# Patient Record
Sex: Female | Born: 1945
Health system: Southern US, Community
[De-identification: ages and names within clinical notes are randomized; demographics above are authoritative.]

## PROBLEM LIST (undated history)

## (undated) DIAGNOSIS — S42209A Unspecified fracture of upper end of unspecified humerus, initial encounter for closed fracture: Secondary | ICD-10-CM

## (undated) DIAGNOSIS — D649 Anemia, unspecified: Secondary | ICD-10-CM

## (undated) DIAGNOSIS — T402X5A Adverse effect of other opioids, initial encounter: Secondary | ICD-10-CM

## (undated) DIAGNOSIS — M419 Scoliosis, unspecified: Secondary | ICD-10-CM

## (undated) DIAGNOSIS — K5903 Drug induced constipation: Secondary | ICD-10-CM

## (undated) DIAGNOSIS — M1990 Unspecified osteoarthritis, unspecified site: Secondary | ICD-10-CM

## (undated) DIAGNOSIS — E039 Hypothyroidism, unspecified: Secondary | ICD-10-CM

## (undated) HISTORY — DX: Scoliosis, unspecified: M41.9

## (undated) HISTORY — PX: OTHER SURGICAL HISTORY: SHX169

## (undated) HISTORY — DX: Hypothyroidism, unspecified: E03.9

## (undated) HISTORY — PX: BREAST SURGERY: SHX581

---

## 1998-08-07 ENCOUNTER — Other Ambulatory Visit: Admission: RE | Admit: 1998-08-07 | Discharge: 1998-08-07 | Payer: Self-pay | Admitting: Obstetrics and Gynecology

## 1999-09-04 ENCOUNTER — Encounter: Admission: RE | Admit: 1999-09-04 | Discharge: 1999-09-04 | Payer: Self-pay | Admitting: Family Medicine

## 1999-09-04 ENCOUNTER — Encounter: Payer: Self-pay | Admitting: Family Medicine

## 2000-08-17 ENCOUNTER — Encounter: Payer: Self-pay | Admitting: Family Medicine

## 2000-08-17 ENCOUNTER — Encounter: Admission: RE | Admit: 2000-08-17 | Discharge: 2000-08-17 | Payer: Self-pay | Admitting: Family Medicine

## 2001-01-02 ENCOUNTER — Other Ambulatory Visit: Admission: RE | Admit: 2001-01-02 | Discharge: 2001-01-02 | Payer: Self-pay | Admitting: Obstetrics and Gynecology

## 2003-02-04 ENCOUNTER — Other Ambulatory Visit: Admission: RE | Admit: 2003-02-04 | Discharge: 2003-02-04 | Payer: Self-pay | Admitting: Obstetrics and Gynecology

## 2010-09-25 ENCOUNTER — Encounter: Payer: Self-pay | Admitting: Family Medicine

## 2010-09-25 ENCOUNTER — Ambulatory Visit (INDEPENDENT_AMBULATORY_CARE_PROVIDER_SITE_OTHER): Payer: BC Managed Care – PPO | Admitting: Family Medicine

## 2010-09-25 ENCOUNTER — Ambulatory Visit (HOSPITAL_BASED_OUTPATIENT_CLINIC_OR_DEPARTMENT_OTHER)
Admission: RE | Admit: 2010-09-25 | Discharge: 2010-09-25 | Disposition: A | Discharge status: Home / Self Care | Payer: BC Managed Care – PPO | Source: Ambulatory Visit | Attending: Family Medicine | Admitting: Family Medicine

## 2010-09-25 VITALS — BP 168/84 | HR 80 | Temp 98.5°F | Ht 60.0 in | Wt 103.8 lb

## 2010-09-25 DIAGNOSIS — M2140 Flat foot [pes planus] (acquired), unspecified foot: Secondary | ICD-10-CM

## 2010-09-25 DIAGNOSIS — S8990XA Unspecified injury of unspecified lower leg, initial encounter: Secondary | ICD-10-CM

## 2010-09-25 DIAGNOSIS — M25579 Pain in unspecified ankle and joints of unspecified foot: Secondary | ICD-10-CM | POA: Insufficient documentation

## 2010-09-25 DIAGNOSIS — M79609 Pain in unspecified limb: Secondary | ICD-10-CM

## 2010-09-25 DIAGNOSIS — M7740 Metatarsalgia, unspecified foot: Secondary | ICD-10-CM

## 2010-09-25 DIAGNOSIS — W208XXA Other cause of strike by thrown, projected or falling object, initial encounter: Secondary | ICD-10-CM

## 2010-09-25 DIAGNOSIS — M79671 Pain in right foot: Secondary | ICD-10-CM

## 2010-09-25 DIAGNOSIS — M19079 Primary osteoarthritis, unspecified ankle and foot: Secondary | ICD-10-CM | POA: Insufficient documentation

## 2010-09-25 DIAGNOSIS — M7750 Other enthesopathy of unspecified foot: Secondary | ICD-10-CM

## 2010-09-25 DIAGNOSIS — S99929A Unspecified injury of unspecified foot, initial encounter: Secondary | ICD-10-CM

## 2010-09-25 MED ORDER — DICLOFENAC SODIUM 75 MG PO TBEC: 75.0000 mg | DELAYED_RELEASE_TABLET | Freq: Two times a day (BID) | ORAL | Status: AC

## 2010-09-25 NOTE — Patient Instructions (Addendum)
Your x-rays on one view show a probable nondisplaced fracture of your 2nd metatarsal. This heals really well in 4-6 weeks though nutrient foramina also look like this (where blood vessels go into the bone). Wear comfortable shoes with your orthotics.  These are in excellent shape. Ice the area 15 minutes at a time 3-4 times a day. Voltaren twice a day with food for pain then as needed. Postop shoe or boot is a consideration if limping (I do not think you need either of these). The metatarsal pads should be behind the bones of your forefoot (should feel like a lump in your arch). Ok to exercise as long as not limping and pain is less than a 3 on a scale of 1-10. Follow up with me in 4 weeks for a recheck on how you're doing - you can cancel if you're completely better.

## 2010-09-26 ENCOUNTER — Encounter: Payer: Self-pay | Admitting: Family Medicine

## 2010-09-26 DIAGNOSIS — M79671 Pain in right foot: Secondary | ICD-10-CM | POA: Insufficient documentation

## 2010-09-26 DIAGNOSIS — M7740 Metatarsalgia, unspecified foot: Secondary | ICD-10-CM | POA: Insufficient documentation

## 2010-09-26 DIAGNOSIS — M2140 Flat foot [pes planus] (acquired), unspecified foot: Secondary | ICD-10-CM | POA: Insufficient documentation

## 2010-09-26 NOTE — Assessment & Plan Note (Signed)
shown how to use metatarsal pads

## 2010-09-26 NOTE — Assessment & Plan Note (Signed)
continue use of orthotics.

## 2010-09-26 NOTE — Assessment & Plan Note (Signed)
x-rays on one view show possible 2nd MT fracture vs nutrient foramen. She is tender at this area so may be a true fracture though fractures at this location typically heal very well at 4-6 weeks (good blood flow, excellent stability from surrounding structures).  She is walking without a limp and with minimal pain on regular activities.  We discussed postop shoe and cam walker but as she has only mild pain, feel she can do well with a supportive shoe and her orthotics.  Icing, continue voltaren, elevation.  I showed her how to use metatarsal pads as she does have transverse arch collapse and reported sometimes she'll get pain in forefoot (prior to injury) especially if she walks barefoot.

## 2010-09-26 NOTE — Progress Notes (Signed)
Subjective:    Patient ID: [name] Ruiz, female    DOB: 1945-04-30, 65 y.o.   MRN: [id number]  HPI PCP: Dr. Mila Palmer  65 yo F here with right foot injury.  Patient reports approximately 1 week ago she dropped a bottle of Windex on left foot then again the next day she dropped a shampoo bottle on right forefoot. Felt ok after first injury but after second, developed swelling but no bruising, pain at area dropped bottle. Pain has persisted and is now a 5/10 but not limping. Has been unable to walk as much as she would like for exercise - only able to walk for 20 minutes and had to stop due to pain. Since Monday has taken voltaren which has helped A masseuse also helped massage the area which helped.  Iced last night, tried epsom salt. 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 She has seen a podiatrist in the past, treated with orthotics that are very comfortable.  Past Medical History  Diagnosis Date  . Hypothyroidism   . Scoliosis     No current outpatient prescriptions on file prior to visit.    No past surgical history on file.  Allergies  Allergen Reactions  . Cortisone   . Seldane (Terfenadine)     History   Social History  . Marital Status: Married    Spouse Name: N/A    Number of Children: N/A  . Years of Education: N/A   Occupational History  . Not on file.   Social History Main Topics  . Smoking status: Never Smoker   . Smokeless tobacco: Not on file  . Alcohol Use: Not on file  . Drug Use: Not on file  . Sexually Active: Not on file   Other Topics Concern  . Not on file   Social History Narrative  . No narrative on file    Family History  Problem Relation Age of Onset  . Hypertension Mother   . Heart attack Mother   . Diabetes Neg Hx     BP 168/84  Pulse 80  Temp(Src) 98.5 F (36.9 C) (Oral)  Ht 5' (1.524 m)  Wt 103 lb 12.8 oz (47.083 kg)  BMI 20.27 kg/m2  Review of  Systems See HPI above.    Objective:   Physical Exam Gen: NAD R foot: Minimal swelling forefoot but no bruising, gross deformity. Mod overpronation/pes planus.  Collapse of 2nd-4th MT heads but no callus formation. TTP 2nd and 3rd MTs distally.  No other TTP about foot or ankle. FROM ankle, pain on dorsiflexion of toes mostly at 2nd/3rd MT area. Sensation intact to light touch, < 2 sec cap refill.     Assessment & Plan:  1. Right foot pain - x-rays on one view show possible 2nd MT fracture vs nutrient foramen. She is tender at this area so may be a true fracture though fractures at this location typically heal very well at 4-6 weeks (good blood flow, excellent stability from surrounding structures).  She is walking without a limp and with minimal pain on regular activities.  We discussed postop shoe and cam walker but as she has only mild pain, feel she can do well with a supportive shoe and her orthotics.  Icing, continue voltaren, elevation.  I showed her how to use metatarsal pads as she does have transverse arch collapse and reported sometimes she'll get pain in forefoot (prior to injury) especially if she  walks barefoot.  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  Do not think surgical intervention warranted unless clinically develops worsening pain at this area.  2. Metatarsalgia - shown how to use metatarsal pads  3. Pes planus - continue use of orthotics.

## 2011-06-23 DIAGNOSIS — M545 Low back pain: Secondary | ICD-10-CM | POA: Diagnosis not present

## 2011-06-23 DIAGNOSIS — M999 Biomechanical lesion, unspecified: Secondary | ICD-10-CM | POA: Diagnosis not present

## 2011-07-07 DIAGNOSIS — M545 Low back pain: Secondary | ICD-10-CM | POA: Diagnosis not present

## 2011-07-07 DIAGNOSIS — M999 Biomechanical lesion, unspecified: Secondary | ICD-10-CM | POA: Diagnosis not present

## 2011-07-13 DIAGNOSIS — E039 Hypothyroidism, unspecified: Secondary | ICD-10-CM | POA: Diagnosis not present

## 2011-07-21 DIAGNOSIS — M999 Biomechanical lesion, unspecified: Secondary | ICD-10-CM | POA: Diagnosis not present

## 2011-07-21 DIAGNOSIS — M545 Low back pain: Secondary | ICD-10-CM | POA: Diagnosis not present

## 2011-07-30 DIAGNOSIS — M25579 Pain in unspecified ankle and joints of unspecified foot: Secondary | ICD-10-CM | POA: Diagnosis not present

## 2011-07-30 DIAGNOSIS — M359 Systemic involvement of connective tissue, unspecified: Secondary | ICD-10-CM | POA: Diagnosis not present

## 2011-08-04 DIAGNOSIS — M5137 Other intervertebral disc degeneration, lumbosacral region: Secondary | ICD-10-CM | POA: Diagnosis not present

## 2011-08-04 DIAGNOSIS — M5430 Sciatica, unspecified side: Secondary | ICD-10-CM | POA: Diagnosis not present

## 2011-08-04 DIAGNOSIS — M999 Biomechanical lesion, unspecified: Secondary | ICD-10-CM | POA: Diagnosis not present

## 2011-08-04 DIAGNOSIS — M5106 Intervertebral disc disorders with myelopathy, lumbar region: Secondary | ICD-10-CM | POA: Diagnosis not present

## 2011-08-10 DIAGNOSIS — M999 Biomechanical lesion, unspecified: Secondary | ICD-10-CM | POA: Diagnosis not present

## 2011-08-10 DIAGNOSIS — M5137 Other intervertebral disc degeneration, lumbosacral region: Secondary | ICD-10-CM | POA: Diagnosis not present

## 2011-08-10 DIAGNOSIS — M5106 Intervertebral disc disorders with myelopathy, lumbar region: Secondary | ICD-10-CM | POA: Diagnosis not present

## 2011-08-10 DIAGNOSIS — M5430 Sciatica, unspecified side: Secondary | ICD-10-CM | POA: Diagnosis not present

## 2011-09-08 DIAGNOSIS — M5106 Intervertebral disc disorders with myelopathy, lumbar region: Secondary | ICD-10-CM | POA: Diagnosis not present

## 2011-09-08 DIAGNOSIS — M5430 Sciatica, unspecified side: Secondary | ICD-10-CM | POA: Diagnosis not present

## 2011-09-08 DIAGNOSIS — M999 Biomechanical lesion, unspecified: Secondary | ICD-10-CM | POA: Diagnosis not present

## 2011-09-08 DIAGNOSIS — M5137 Other intervertebral disc degeneration, lumbosacral region: Secondary | ICD-10-CM | POA: Diagnosis not present

## 2011-09-09 DIAGNOSIS — R262 Difficulty in walking, not elsewhere classified: Secondary | ICD-10-CM | POA: Diagnosis not present

## 2011-09-09 DIAGNOSIS — L988 Other specified disorders of the skin and subcutaneous tissue: Secondary | ICD-10-CM | POA: Diagnosis not present

## 2011-09-22 DIAGNOSIS — M999 Biomechanical lesion, unspecified: Secondary | ICD-10-CM | POA: Diagnosis not present

## 2011-09-22 DIAGNOSIS — M5106 Intervertebral disc disorders with myelopathy, lumbar region: Secondary | ICD-10-CM | POA: Diagnosis not present

## 2011-09-22 DIAGNOSIS — M5430 Sciatica, unspecified side: Secondary | ICD-10-CM | POA: Diagnosis not present

## 2011-09-22 DIAGNOSIS — M5137 Other intervertebral disc degeneration, lumbosacral region: Secondary | ICD-10-CM | POA: Diagnosis not present

## 2011-09-23 DIAGNOSIS — M359 Systemic involvement of connective tissue, unspecified: Secondary | ICD-10-CM | POA: Diagnosis not present

## 2011-09-23 DIAGNOSIS — Z0000 Encounter for general adult medical examination without abnormal findings: Secondary | ICD-10-CM | POA: Diagnosis not present

## 2011-09-23 DIAGNOSIS — R894 Abnormal immunological findings in specimens from other organs, systems and tissues: Secondary | ICD-10-CM | POA: Diagnosis not present

## 2011-09-23 DIAGNOSIS — M949 Disorder of cartilage, unspecified: Secondary | ICD-10-CM | POA: Diagnosis not present

## 2011-09-23 DIAGNOSIS — I10 Essential (primary) hypertension: Secondary | ICD-10-CM | POA: Diagnosis not present

## 2011-09-23 DIAGNOSIS — E785 Hyperlipidemia, unspecified: Secondary | ICD-10-CM | POA: Diagnosis not present

## 2011-09-23 DIAGNOSIS — E069 Thyroiditis, unspecified: Secondary | ICD-10-CM | POA: Diagnosis not present

## 2011-09-23 DIAGNOSIS — Z23 Encounter for immunization: Secondary | ICD-10-CM | POA: Diagnosis not present

## 2012-02-02 DIAGNOSIS — M5137 Other intervertebral disc degeneration, lumbosacral region: Secondary | ICD-10-CM | POA: Diagnosis not present

## 2012-02-02 DIAGNOSIS — M999 Biomechanical lesion, unspecified: Secondary | ICD-10-CM | POA: Diagnosis not present

## 2012-02-02 DIAGNOSIS — M5106 Intervertebral disc disorders with myelopathy, lumbar region: Secondary | ICD-10-CM | POA: Diagnosis not present

## 2012-02-02 DIAGNOSIS — M5430 Sciatica, unspecified side: Secondary | ICD-10-CM | POA: Diagnosis not present

## 2012-02-10 DIAGNOSIS — E069 Thyroiditis, unspecified: Secondary | ICD-10-CM | POA: Diagnosis not present

## 2012-02-16 DIAGNOSIS — M5106 Intervertebral disc disorders with myelopathy, lumbar region: Secondary | ICD-10-CM | POA: Diagnosis not present

## 2012-02-16 DIAGNOSIS — M5430 Sciatica, unspecified side: Secondary | ICD-10-CM | POA: Diagnosis not present

## 2012-02-16 DIAGNOSIS — M5137 Other intervertebral disc degeneration, lumbosacral region: Secondary | ICD-10-CM | POA: Diagnosis not present

## 2012-02-16 DIAGNOSIS — M999 Biomechanical lesion, unspecified: Secondary | ICD-10-CM | POA: Diagnosis not present

## 2012-03-13 DIAGNOSIS — M5106 Intervertebral disc disorders with myelopathy, lumbar region: Secondary | ICD-10-CM | POA: Diagnosis not present

## 2012-03-13 DIAGNOSIS — M999 Biomechanical lesion, unspecified: Secondary | ICD-10-CM | POA: Diagnosis not present

## 2012-03-13 DIAGNOSIS — M5430 Sciatica, unspecified side: Secondary | ICD-10-CM | POA: Diagnosis not present

## 2012-03-13 DIAGNOSIS — M5137 Other intervertebral disc degeneration, lumbosacral region: Secondary | ICD-10-CM | POA: Diagnosis not present

## 2012-05-09 DIAGNOSIS — M999 Biomechanical lesion, unspecified: Secondary | ICD-10-CM | POA: Diagnosis not present

## 2012-05-09 DIAGNOSIS — M5430 Sciatica, unspecified side: Secondary | ICD-10-CM | POA: Diagnosis not present

## 2012-05-09 DIAGNOSIS — M5137 Other intervertebral disc degeneration, lumbosacral region: Secondary | ICD-10-CM | POA: Diagnosis not present

## 2012-05-09 DIAGNOSIS — M5106 Intervertebral disc disorders with myelopathy, lumbar region: Secondary | ICD-10-CM | POA: Diagnosis not present

## 2012-06-22 DIAGNOSIS — M359 Systemic involvement of connective tissue, unspecified: Secondary | ICD-10-CM | POA: Diagnosis not present

## 2012-06-22 DIAGNOSIS — R894 Abnormal immunological findings in specimens from other organs, systems and tissues: Secondary | ICD-10-CM | POA: Diagnosis not present

## 2012-06-22 DIAGNOSIS — M25579 Pain in unspecified ankle and joints of unspecified foot: Secondary | ICD-10-CM | POA: Diagnosis not present

## 2012-07-03 DIAGNOSIS — M5106 Intervertebral disc disorders with myelopathy, lumbar region: Secondary | ICD-10-CM | POA: Diagnosis not present

## 2012-07-03 DIAGNOSIS — M5137 Other intervertebral disc degeneration, lumbosacral region: Secondary | ICD-10-CM | POA: Diagnosis not present

## 2012-07-03 DIAGNOSIS — M999 Biomechanical lesion, unspecified: Secondary | ICD-10-CM | POA: Diagnosis not present

## 2012-07-03 DIAGNOSIS — M5430 Sciatica, unspecified side: Secondary | ICD-10-CM | POA: Diagnosis not present

## 2012-07-18 DIAGNOSIS — M5137 Other intervertebral disc degeneration, lumbosacral region: Secondary | ICD-10-CM | POA: Diagnosis not present

## 2012-07-18 DIAGNOSIS — M5106 Intervertebral disc disorders with myelopathy, lumbar region: Secondary | ICD-10-CM | POA: Diagnosis not present

## 2012-07-18 DIAGNOSIS — M5430 Sciatica, unspecified side: Secondary | ICD-10-CM | POA: Diagnosis not present

## 2012-07-18 DIAGNOSIS — M999 Biomechanical lesion, unspecified: Secondary | ICD-10-CM | POA: Diagnosis not present

## 2012-07-31 DIAGNOSIS — M999 Biomechanical lesion, unspecified: Secondary | ICD-10-CM | POA: Diagnosis not present

## 2012-07-31 DIAGNOSIS — M5137 Other intervertebral disc degeneration, lumbosacral region: Secondary | ICD-10-CM | POA: Diagnosis not present

## 2012-07-31 DIAGNOSIS — M5106 Intervertebral disc disorders with myelopathy, lumbar region: Secondary | ICD-10-CM | POA: Diagnosis not present

## 2012-07-31 DIAGNOSIS — M5430 Sciatica, unspecified side: Secondary | ICD-10-CM | POA: Diagnosis not present

## 2012-08-15 DIAGNOSIS — M5137 Other intervertebral disc degeneration, lumbosacral region: Secondary | ICD-10-CM | POA: Diagnosis not present

## 2012-08-15 DIAGNOSIS — M999 Biomechanical lesion, unspecified: Secondary | ICD-10-CM | POA: Diagnosis not present

## 2012-08-15 DIAGNOSIS — M5430 Sciatica, unspecified side: Secondary | ICD-10-CM | POA: Diagnosis not present

## 2012-08-15 DIAGNOSIS — M5106 Intervertebral disc disorders with myelopathy, lumbar region: Secondary | ICD-10-CM | POA: Diagnosis not present

## 2012-09-01 DIAGNOSIS — M5430 Sciatica, unspecified side: Secondary | ICD-10-CM | POA: Diagnosis not present

## 2012-09-01 DIAGNOSIS — M999 Biomechanical lesion, unspecified: Secondary | ICD-10-CM | POA: Diagnosis not present

## 2012-09-01 DIAGNOSIS — M5106 Intervertebral disc disorders with myelopathy, lumbar region: Secondary | ICD-10-CM | POA: Diagnosis not present

## 2012-09-01 DIAGNOSIS — M5137 Other intervertebral disc degeneration, lumbosacral region: Secondary | ICD-10-CM | POA: Diagnosis not present

## 2012-09-19 DIAGNOSIS — M5430 Sciatica, unspecified side: Secondary | ICD-10-CM | POA: Diagnosis not present

## 2012-09-19 DIAGNOSIS — M999 Biomechanical lesion, unspecified: Secondary | ICD-10-CM | POA: Diagnosis not present

## 2012-09-19 DIAGNOSIS — M5106 Intervertebral disc disorders with myelopathy, lumbar region: Secondary | ICD-10-CM | POA: Diagnosis not present

## 2012-09-19 DIAGNOSIS — M5137 Other intervertebral disc degeneration, lumbosacral region: Secondary | ICD-10-CM | POA: Diagnosis not present

## 2012-09-27 DIAGNOSIS — M5430 Sciatica, unspecified side: Secondary | ICD-10-CM | POA: Diagnosis not present

## 2012-09-27 DIAGNOSIS — M5106 Intervertebral disc disorders with myelopathy, lumbar region: Secondary | ICD-10-CM | POA: Diagnosis not present

## 2012-09-27 DIAGNOSIS — M999 Biomechanical lesion, unspecified: Secondary | ICD-10-CM | POA: Diagnosis not present

## 2012-09-27 DIAGNOSIS — M5137 Other intervertebral disc degeneration, lumbosacral region: Secondary | ICD-10-CM | POA: Diagnosis not present

## 2012-10-02 DIAGNOSIS — E039 Hypothyroidism, unspecified: Secondary | ICD-10-CM | POA: Diagnosis not present

## 2012-10-02 DIAGNOSIS — M899 Disorder of bone, unspecified: Secondary | ICD-10-CM | POA: Diagnosis not present

## 2012-10-02 DIAGNOSIS — Z23 Encounter for immunization: Secondary | ICD-10-CM | POA: Diagnosis not present

## 2012-10-02 DIAGNOSIS — E785 Hyperlipidemia, unspecified: Secondary | ICD-10-CM | POA: Diagnosis not present

## 2012-10-02 DIAGNOSIS — Z0000 Encounter for general adult medical examination without abnormal findings: Secondary | ICD-10-CM | POA: Diagnosis not present

## 2012-10-02 DIAGNOSIS — E871 Hypo-osmolality and hyponatremia: Secondary | ICD-10-CM | POA: Diagnosis not present

## 2012-10-02 DIAGNOSIS — M949 Disorder of cartilage, unspecified: Secondary | ICD-10-CM | POA: Diagnosis not present

## 2012-10-10 DIAGNOSIS — M999 Biomechanical lesion, unspecified: Secondary | ICD-10-CM | POA: Diagnosis not present

## 2012-10-10 DIAGNOSIS — M5106 Intervertebral disc disorders with myelopathy, lumbar region: Secondary | ICD-10-CM | POA: Diagnosis not present

## 2012-10-10 DIAGNOSIS — M5430 Sciatica, unspecified side: Secondary | ICD-10-CM | POA: Diagnosis not present

## 2012-10-10 DIAGNOSIS — M5137 Other intervertebral disc degeneration, lumbosacral region: Secondary | ICD-10-CM | POA: Diagnosis not present

## 2012-12-01 DIAGNOSIS — E039 Hypothyroidism, unspecified: Secondary | ICD-10-CM | POA: Diagnosis not present

## 2012-12-01 DIAGNOSIS — Z23 Encounter for immunization: Secondary | ICD-10-CM | POA: Diagnosis not present

## 2012-12-05 DIAGNOSIS — M999 Biomechanical lesion, unspecified: Secondary | ICD-10-CM | POA: Diagnosis not present

## 2012-12-05 DIAGNOSIS — M5430 Sciatica, unspecified side: Secondary | ICD-10-CM | POA: Diagnosis not present

## 2012-12-05 DIAGNOSIS — M5137 Other intervertebral disc degeneration, lumbosacral region: Secondary | ICD-10-CM | POA: Diagnosis not present

## 2012-12-05 DIAGNOSIS — M5106 Intervertebral disc disorders with myelopathy, lumbar region: Secondary | ICD-10-CM | POA: Diagnosis not present

## 2012-12-26 DIAGNOSIS — M5430 Sciatica, unspecified side: Secondary | ICD-10-CM | POA: Diagnosis not present

## 2012-12-26 DIAGNOSIS — M5137 Other intervertebral disc degeneration, lumbosacral region: Secondary | ICD-10-CM | POA: Diagnosis not present

## 2012-12-26 DIAGNOSIS — M5106 Intervertebral disc disorders with myelopathy, lumbar region: Secondary | ICD-10-CM | POA: Diagnosis not present

## 2012-12-26 DIAGNOSIS — M999 Biomechanical lesion, unspecified: Secondary | ICD-10-CM | POA: Diagnosis not present

## 2013-01-03 DIAGNOSIS — Z23 Encounter for immunization: Secondary | ICD-10-CM | POA: Diagnosis not present

## 2013-01-18 DIAGNOSIS — M5137 Other intervertebral disc degeneration, lumbosacral region: Secondary | ICD-10-CM | POA: Diagnosis not present

## 2013-01-18 DIAGNOSIS — M999 Biomechanical lesion, unspecified: Secondary | ICD-10-CM | POA: Diagnosis not present

## 2013-01-18 DIAGNOSIS — M5106 Intervertebral disc disorders with myelopathy, lumbar region: Secondary | ICD-10-CM | POA: Diagnosis not present

## 2013-01-18 DIAGNOSIS — M5430 Sciatica, unspecified side: Secondary | ICD-10-CM | POA: Diagnosis not present

## 2013-01-30 DIAGNOSIS — M5430 Sciatica, unspecified side: Secondary | ICD-10-CM | POA: Diagnosis not present

## 2013-01-30 DIAGNOSIS — M5137 Other intervertebral disc degeneration, lumbosacral region: Secondary | ICD-10-CM | POA: Diagnosis not present

## 2013-01-30 DIAGNOSIS — M999 Biomechanical lesion, unspecified: Secondary | ICD-10-CM | POA: Diagnosis not present

## 2013-01-30 DIAGNOSIS — M5106 Intervertebral disc disorders with myelopathy, lumbar region: Secondary | ICD-10-CM | POA: Diagnosis not present

## 2013-02-13 DIAGNOSIS — M999 Biomechanical lesion, unspecified: Secondary | ICD-10-CM | POA: Diagnosis not present

## 2013-02-13 DIAGNOSIS — M5137 Other intervertebral disc degeneration, lumbosacral region: Secondary | ICD-10-CM | POA: Diagnosis not present

## 2013-02-13 DIAGNOSIS — M5430 Sciatica, unspecified side: Secondary | ICD-10-CM | POA: Diagnosis not present

## 2013-02-13 DIAGNOSIS — M5106 Intervertebral disc disorders with myelopathy, lumbar region: Secondary | ICD-10-CM | POA: Diagnosis not present

## 2013-02-27 DIAGNOSIS — M999 Biomechanical lesion, unspecified: Secondary | ICD-10-CM | POA: Diagnosis not present

## 2013-02-27 DIAGNOSIS — M5106 Intervertebral disc disorders with myelopathy, lumbar region: Secondary | ICD-10-CM | POA: Diagnosis not present

## 2013-02-27 DIAGNOSIS — M5137 Other intervertebral disc degeneration, lumbosacral region: Secondary | ICD-10-CM | POA: Diagnosis not present

## 2013-02-27 DIAGNOSIS — M5430 Sciatica, unspecified side: Secondary | ICD-10-CM | POA: Diagnosis not present

## 2013-03-20 DIAGNOSIS — R197 Diarrhea, unspecified: Secondary | ICD-10-CM | POA: Diagnosis not present

## 2013-03-20 DIAGNOSIS — Z79899 Other long term (current) drug therapy: Secondary | ICD-10-CM | POA: Diagnosis not present

## 2013-03-20 DIAGNOSIS — M5137 Other intervertebral disc degeneration, lumbosacral region: Secondary | ICD-10-CM | POA: Diagnosis not present

## 2013-03-20 DIAGNOSIS — M999 Biomechanical lesion, unspecified: Secondary | ICD-10-CM | POA: Diagnosis not present

## 2013-03-20 DIAGNOSIS — E785 Hyperlipidemia, unspecified: Secondary | ICD-10-CM | POA: Diagnosis not present

## 2013-03-20 DIAGNOSIS — E039 Hypothyroidism, unspecified: Secondary | ICD-10-CM | POA: Diagnosis not present

## 2013-03-20 DIAGNOSIS — M5106 Intervertebral disc disorders with myelopathy, lumbar region: Secondary | ICD-10-CM | POA: Diagnosis not present

## 2013-03-20 DIAGNOSIS — M5430 Sciatica, unspecified side: Secondary | ICD-10-CM | POA: Diagnosis not present

## 2013-03-21 DIAGNOSIS — E039 Hypothyroidism, unspecified: Secondary | ICD-10-CM | POA: Diagnosis not present

## 2013-03-21 DIAGNOSIS — R197 Diarrhea, unspecified: Secondary | ICD-10-CM | POA: Diagnosis not present

## 2013-03-21 DIAGNOSIS — E785 Hyperlipidemia, unspecified: Secondary | ICD-10-CM | POA: Diagnosis not present

## 2013-04-10 DIAGNOSIS — M999 Biomechanical lesion, unspecified: Secondary | ICD-10-CM | POA: Diagnosis not present

## 2013-04-10 DIAGNOSIS — M5430 Sciatica, unspecified side: Secondary | ICD-10-CM | POA: Diagnosis not present

## 2013-04-10 DIAGNOSIS — M5106 Intervertebral disc disorders with myelopathy, lumbar region: Secondary | ICD-10-CM | POA: Diagnosis not present

## 2013-04-10 DIAGNOSIS — M5137 Other intervertebral disc degeneration, lumbosacral region: Secondary | ICD-10-CM | POA: Diagnosis not present

## 2013-04-18 DIAGNOSIS — M5137 Other intervertebral disc degeneration, lumbosacral region: Secondary | ICD-10-CM | POA: Diagnosis not present

## 2013-04-18 DIAGNOSIS — M5430 Sciatica, unspecified side: Secondary | ICD-10-CM | POA: Diagnosis not present

## 2013-04-18 DIAGNOSIS — M5106 Intervertebral disc disorders with myelopathy, lumbar region: Secondary | ICD-10-CM | POA: Diagnosis not present

## 2013-04-18 DIAGNOSIS — M999 Biomechanical lesion, unspecified: Secondary | ICD-10-CM | POA: Diagnosis not present

## 2013-05-01 DIAGNOSIS — M5137 Other intervertebral disc degeneration, lumbosacral region: Secondary | ICD-10-CM | POA: Diagnosis not present

## 2013-05-01 DIAGNOSIS — M999 Biomechanical lesion, unspecified: Secondary | ICD-10-CM | POA: Diagnosis not present

## 2013-05-01 DIAGNOSIS — M5106 Intervertebral disc disorders with myelopathy, lumbar region: Secondary | ICD-10-CM | POA: Diagnosis not present

## 2013-05-01 DIAGNOSIS — M5430 Sciatica, unspecified side: Secondary | ICD-10-CM | POA: Diagnosis not present

## 2013-05-04 DIAGNOSIS — Z23 Encounter for immunization: Secondary | ICD-10-CM | POA: Diagnosis not present

## 2013-06-19 DIAGNOSIS — E039 Hypothyroidism, unspecified: Secondary | ICD-10-CM | POA: Diagnosis not present

## 2013-06-19 DIAGNOSIS — E871 Hypo-osmolality and hyponatremia: Secondary | ICD-10-CM | POA: Diagnosis not present

## 2013-06-20 DIAGNOSIS — M999 Biomechanical lesion, unspecified: Secondary | ICD-10-CM | POA: Diagnosis not present

## 2013-06-20 DIAGNOSIS — M5106 Intervertebral disc disorders with myelopathy, lumbar region: Secondary | ICD-10-CM | POA: Diagnosis not present

## 2013-06-20 DIAGNOSIS — M5430 Sciatica, unspecified side: Secondary | ICD-10-CM | POA: Diagnosis not present

## 2013-06-20 DIAGNOSIS — M5137 Other intervertebral disc degeneration, lumbosacral region: Secondary | ICD-10-CM | POA: Diagnosis not present

## 2013-08-15 DIAGNOSIS — M5137 Other intervertebral disc degeneration, lumbosacral region: Secondary | ICD-10-CM | POA: Diagnosis not present

## 2013-08-15 DIAGNOSIS — M999 Biomechanical lesion, unspecified: Secondary | ICD-10-CM | POA: Diagnosis not present

## 2013-08-15 DIAGNOSIS — M5430 Sciatica, unspecified side: Secondary | ICD-10-CM | POA: Diagnosis not present

## 2013-08-15 DIAGNOSIS — M5106 Intervertebral disc disorders with myelopathy, lumbar region: Secondary | ICD-10-CM | POA: Diagnosis not present

## 2013-08-30 DIAGNOSIS — M5430 Sciatica, unspecified side: Secondary | ICD-10-CM | POA: Diagnosis not present

## 2013-08-30 DIAGNOSIS — M999 Biomechanical lesion, unspecified: Secondary | ICD-10-CM | POA: Diagnosis not present

## 2013-08-30 DIAGNOSIS — M5106 Intervertebral disc disorders with myelopathy, lumbar region: Secondary | ICD-10-CM | POA: Diagnosis not present

## 2013-08-30 DIAGNOSIS — M5137 Other intervertebral disc degeneration, lumbosacral region: Secondary | ICD-10-CM | POA: Diagnosis not present

## 2013-09-17 DIAGNOSIS — M5430 Sciatica, unspecified side: Secondary | ICD-10-CM | POA: Diagnosis not present

## 2013-09-17 DIAGNOSIS — M5137 Other intervertebral disc degeneration, lumbosacral region: Secondary | ICD-10-CM | POA: Diagnosis not present

## 2013-09-17 DIAGNOSIS — M999 Biomechanical lesion, unspecified: Secondary | ICD-10-CM | POA: Diagnosis not present

## 2013-09-17 DIAGNOSIS — M5106 Intervertebral disc disorders with myelopathy, lumbar region: Secondary | ICD-10-CM | POA: Diagnosis not present

## 2013-10-09 DIAGNOSIS — M5106 Intervertebral disc disorders with myelopathy, lumbar region: Secondary | ICD-10-CM | POA: Diagnosis not present

## 2013-10-09 DIAGNOSIS — M999 Biomechanical lesion, unspecified: Secondary | ICD-10-CM | POA: Diagnosis not present

## 2013-10-09 DIAGNOSIS — M5137 Other intervertebral disc degeneration, lumbosacral region: Secondary | ICD-10-CM | POA: Diagnosis not present

## 2013-10-09 DIAGNOSIS — M5430 Sciatica, unspecified side: Secondary | ICD-10-CM | POA: Diagnosis not present

## 2013-12-19 DIAGNOSIS — M899 Disorder of bone, unspecified: Secondary | ICD-10-CM | POA: Diagnosis not present

## 2013-12-19 DIAGNOSIS — M359 Systemic involvement of connective tissue, unspecified: Secondary | ICD-10-CM | POA: Diagnosis not present

## 2013-12-19 DIAGNOSIS — M949 Disorder of cartilage, unspecified: Secondary | ICD-10-CM | POA: Diagnosis not present

## 2013-12-19 DIAGNOSIS — Z23 Encounter for immunization: Secondary | ICD-10-CM | POA: Diagnosis not present

## 2013-12-19 DIAGNOSIS — E039 Hypothyroidism, unspecified: Secondary | ICD-10-CM | POA: Diagnosis not present

## 2013-12-19 DIAGNOSIS — Z0000 Encounter for general adult medical examination without abnormal findings: Secondary | ICD-10-CM | POA: Diagnosis not present

## 2013-12-19 DIAGNOSIS — D7589 Other specified diseases of blood and blood-forming organs: Secondary | ICD-10-CM | POA: Diagnosis not present

## 2013-12-19 DIAGNOSIS — Z79899 Other long term (current) drug therapy: Secondary | ICD-10-CM | POA: Diagnosis not present

## 2013-12-19 DIAGNOSIS — E785 Hyperlipidemia, unspecified: Secondary | ICD-10-CM | POA: Diagnosis not present

## 2014-01-27 DIAGNOSIS — D72819 Decreased white blood cell count, unspecified: Secondary | ICD-10-CM | POA: Diagnosis not present

## 2014-02-05 DIAGNOSIS — M9903 Segmental and somatic dysfunction of lumbar region: Secondary | ICD-10-CM | POA: Diagnosis not present

## 2014-02-05 DIAGNOSIS — M5136 Other intervertebral disc degeneration, lumbar region: Secondary | ICD-10-CM | POA: Diagnosis not present

## 2014-02-05 DIAGNOSIS — M47816 Spondylosis without myelopathy or radiculopathy, lumbar region: Secondary | ICD-10-CM | POA: Diagnosis not present

## 2014-02-05 DIAGNOSIS — M545 Low back pain: Secondary | ICD-10-CM | POA: Diagnosis not present

## 2014-02-18 DIAGNOSIS — M9903 Segmental and somatic dysfunction of lumbar region: Secondary | ICD-10-CM | POA: Diagnosis not present

## 2014-02-18 DIAGNOSIS — M5136 Other intervertebral disc degeneration, lumbar region: Secondary | ICD-10-CM | POA: Diagnosis not present

## 2014-02-18 DIAGNOSIS — M545 Low back pain: Secondary | ICD-10-CM | POA: Diagnosis not present

## 2014-02-18 DIAGNOSIS — M47816 Spondylosis without myelopathy or radiculopathy, lumbar region: Secondary | ICD-10-CM | POA: Diagnosis not present

## 2014-02-21 DIAGNOSIS — D72819 Decreased white blood cell count, unspecified: Secondary | ICD-10-CM | POA: Diagnosis not present

## 2014-02-26 DIAGNOSIS — M545 Low back pain: Secondary | ICD-10-CM | POA: Diagnosis not present

## 2014-02-26 DIAGNOSIS — M5136 Other intervertebral disc degeneration, lumbar region: Secondary | ICD-10-CM | POA: Diagnosis not present

## 2014-02-26 DIAGNOSIS — M9903 Segmental and somatic dysfunction of lumbar region: Secondary | ICD-10-CM | POA: Diagnosis not present

## 2014-02-26 DIAGNOSIS — M47816 Spondylosis without myelopathy or radiculopathy, lumbar region: Secondary | ICD-10-CM | POA: Diagnosis not present

## 2014-03-21 DIAGNOSIS — E039 Hypothyroidism, unspecified: Secondary | ICD-10-CM | POA: Diagnosis not present

## 2014-03-21 DIAGNOSIS — D7589 Other specified diseases of blood and blood-forming organs: Secondary | ICD-10-CM | POA: Diagnosis not present

## 2014-04-02 DIAGNOSIS — D72819 Decreased white blood cell count, unspecified: Secondary | ICD-10-CM | POA: Diagnosis not present

## 2014-05-16 DIAGNOSIS — D72819 Decreased white blood cell count, unspecified: Secondary | ICD-10-CM | POA: Diagnosis not present

## 2014-05-21 DIAGNOSIS — M47816 Spondylosis without myelopathy or radiculopathy, lumbar region: Secondary | ICD-10-CM | POA: Diagnosis not present

## 2014-05-21 DIAGNOSIS — M9903 Segmental and somatic dysfunction of lumbar region: Secondary | ICD-10-CM | POA: Diagnosis not present

## 2014-05-21 DIAGNOSIS — M5136 Other intervertebral disc degeneration, lumbar region: Secondary | ICD-10-CM | POA: Diagnosis not present

## 2014-05-21 DIAGNOSIS — M545 Low back pain: Secondary | ICD-10-CM | POA: Diagnosis not present

## 2014-07-02 DIAGNOSIS — M5136 Other intervertebral disc degeneration, lumbar region: Secondary | ICD-10-CM | POA: Diagnosis not present

## 2014-07-02 DIAGNOSIS — M9903 Segmental and somatic dysfunction of lumbar region: Secondary | ICD-10-CM | POA: Diagnosis not present

## 2014-07-02 DIAGNOSIS — M47816 Spondylosis without myelopathy or radiculopathy, lumbar region: Secondary | ICD-10-CM | POA: Diagnosis not present

## 2014-07-02 DIAGNOSIS — M545 Low back pain: Secondary | ICD-10-CM | POA: Diagnosis not present

## 2014-07-15 DIAGNOSIS — M47816 Spondylosis without myelopathy or radiculopathy, lumbar region: Secondary | ICD-10-CM | POA: Diagnosis not present

## 2014-07-15 DIAGNOSIS — M9903 Segmental and somatic dysfunction of lumbar region: Secondary | ICD-10-CM | POA: Diagnosis not present

## 2014-07-15 DIAGNOSIS — M545 Low back pain: Secondary | ICD-10-CM | POA: Diagnosis not present

## 2014-07-15 DIAGNOSIS — M5136 Other intervertebral disc degeneration, lumbar region: Secondary | ICD-10-CM | POA: Diagnosis not present

## 2014-10-08 DIAGNOSIS — M5136 Other intervertebral disc degeneration, lumbar region: Secondary | ICD-10-CM | POA: Diagnosis not present

## 2014-10-08 DIAGNOSIS — M47816 Spondylosis without myelopathy or radiculopathy, lumbar region: Secondary | ICD-10-CM | POA: Diagnosis not present

## 2014-10-08 DIAGNOSIS — M9903 Segmental and somatic dysfunction of lumbar region: Secondary | ICD-10-CM | POA: Diagnosis not present

## 2014-10-08 DIAGNOSIS — M545 Low back pain: Secondary | ICD-10-CM | POA: Diagnosis not present

## 2014-10-15 DIAGNOSIS — M545 Low back pain: Secondary | ICD-10-CM | POA: Diagnosis not present

## 2014-10-15 DIAGNOSIS — M9903 Segmental and somatic dysfunction of lumbar region: Secondary | ICD-10-CM | POA: Diagnosis not present

## 2014-10-15 DIAGNOSIS — M5136 Other intervertebral disc degeneration, lumbar region: Secondary | ICD-10-CM | POA: Diagnosis not present

## 2014-10-15 DIAGNOSIS — M47816 Spondylosis without myelopathy or radiculopathy, lumbar region: Secondary | ICD-10-CM | POA: Diagnosis not present

## 2014-10-28 DIAGNOSIS — M9903 Segmental and somatic dysfunction of lumbar region: Secondary | ICD-10-CM | POA: Diagnosis not present

## 2014-10-28 DIAGNOSIS — M5136 Other intervertebral disc degeneration, lumbar region: Secondary | ICD-10-CM | POA: Diagnosis not present

## 2014-10-28 DIAGNOSIS — M545 Low back pain: Secondary | ICD-10-CM | POA: Diagnosis not present

## 2014-10-28 DIAGNOSIS — M47816 Spondylosis without myelopathy or radiculopathy, lumbar region: Secondary | ICD-10-CM | POA: Diagnosis not present

## 2014-11-05 DIAGNOSIS — M5136 Other intervertebral disc degeneration, lumbar region: Secondary | ICD-10-CM | POA: Diagnosis not present

## 2014-11-05 DIAGNOSIS — M545 Low back pain: Secondary | ICD-10-CM | POA: Diagnosis not present

## 2014-11-05 DIAGNOSIS — M47816 Spondylosis without myelopathy or radiculopathy, lumbar region: Secondary | ICD-10-CM | POA: Diagnosis not present

## 2014-11-05 DIAGNOSIS — M9903 Segmental and somatic dysfunction of lumbar region: Secondary | ICD-10-CM | POA: Diagnosis not present

## 2014-11-18 DIAGNOSIS — M47816 Spondylosis without myelopathy or radiculopathy, lumbar region: Secondary | ICD-10-CM | POA: Diagnosis not present

## 2014-11-18 DIAGNOSIS — M545 Low back pain: Secondary | ICD-10-CM | POA: Diagnosis not present

## 2014-11-18 DIAGNOSIS — M9903 Segmental and somatic dysfunction of lumbar region: Secondary | ICD-10-CM | POA: Diagnosis not present

## 2014-11-18 DIAGNOSIS — M5136 Other intervertebral disc degeneration, lumbar region: Secondary | ICD-10-CM | POA: Diagnosis not present

## 2015-01-31 DIAGNOSIS — Z23 Encounter for immunization: Secondary | ICD-10-CM | POA: Diagnosis not present

## 2015-02-11 DIAGNOSIS — D72819 Decreased white blood cell count, unspecified: Secondary | ICD-10-CM | POA: Diagnosis not present

## 2015-02-11 DIAGNOSIS — R7989 Other specified abnormal findings of blood chemistry: Secondary | ICD-10-CM | POA: Diagnosis not present

## 2015-02-11 DIAGNOSIS — E559 Vitamin D deficiency, unspecified: Secondary | ICD-10-CM | POA: Diagnosis not present

## 2015-02-11 DIAGNOSIS — E063 Autoimmune thyroiditis: Secondary | ICD-10-CM | POA: Diagnosis not present

## 2015-03-04 DIAGNOSIS — D72819 Decreased white blood cell count, unspecified: Secondary | ICD-10-CM | POA: Diagnosis not present

## 2015-03-17 DIAGNOSIS — M47816 Spondylosis without myelopathy or radiculopathy, lumbar region: Secondary | ICD-10-CM | POA: Diagnosis not present

## 2015-03-17 DIAGNOSIS — M9903 Segmental and somatic dysfunction of lumbar region: Secondary | ICD-10-CM | POA: Diagnosis not present

## 2015-03-17 DIAGNOSIS — M545 Low back pain: Secondary | ICD-10-CM | POA: Diagnosis not present

## 2015-03-17 DIAGNOSIS — M5136 Other intervertebral disc degeneration, lumbar region: Secondary | ICD-10-CM | POA: Diagnosis not present

## 2015-03-31 DIAGNOSIS — M47816 Spondylosis without myelopathy or radiculopathy, lumbar region: Secondary | ICD-10-CM | POA: Diagnosis not present

## 2015-03-31 DIAGNOSIS — M5136 Other intervertebral disc degeneration, lumbar region: Secondary | ICD-10-CM | POA: Diagnosis not present

## 2015-03-31 DIAGNOSIS — M9903 Segmental and somatic dysfunction of lumbar region: Secondary | ICD-10-CM | POA: Diagnosis not present

## 2015-03-31 DIAGNOSIS — M545 Low back pain: Secondary | ICD-10-CM | POA: Diagnosis not present

## 2015-04-01 DIAGNOSIS — N3000 Acute cystitis without hematuria: Secondary | ICD-10-CM | POA: Diagnosis not present

## 2015-04-14 DIAGNOSIS — M47816 Spondylosis without myelopathy or radiculopathy, lumbar region: Secondary | ICD-10-CM | POA: Diagnosis not present

## 2015-04-14 DIAGNOSIS — M9903 Segmental and somatic dysfunction of lumbar region: Secondary | ICD-10-CM | POA: Diagnosis not present

## 2015-04-14 DIAGNOSIS — M545 Low back pain: Secondary | ICD-10-CM | POA: Diagnosis not present

## 2015-04-14 DIAGNOSIS — M5136 Other intervertebral disc degeneration, lumbar region: Secondary | ICD-10-CM | POA: Diagnosis not present

## 2015-07-07 DIAGNOSIS — R7989 Other specified abnormal findings of blood chemistry: Secondary | ICD-10-CM | POA: Diagnosis not present

## 2015-07-07 DIAGNOSIS — E559 Vitamin D deficiency, unspecified: Secondary | ICD-10-CM | POA: Diagnosis not present

## 2015-07-07 DIAGNOSIS — E063 Autoimmune thyroiditis: Secondary | ICD-10-CM | POA: Diagnosis not present

## 2015-07-07 DIAGNOSIS — Z136 Encounter for screening for cardiovascular disorders: Secondary | ICD-10-CM | POA: Diagnosis not present

## 2015-07-14 DIAGNOSIS — D72819 Decreased white blood cell count, unspecified: Secondary | ICD-10-CM | POA: Diagnosis not present

## 2015-08-05 DIAGNOSIS — Z23 Encounter for immunization: Secondary | ICD-10-CM | POA: Diagnosis not present

## 2015-08-05 DIAGNOSIS — E039 Hypothyroidism, unspecified: Secondary | ICD-10-CM | POA: Diagnosis not present

## 2015-08-05 DIAGNOSIS — Z0000 Encounter for general adult medical examination without abnormal findings: Secondary | ICD-10-CM | POA: Diagnosis not present

## 2015-09-02 DIAGNOSIS — M47816 Spondylosis without myelopathy or radiculopathy, lumbar region: Secondary | ICD-10-CM | POA: Diagnosis not present

## 2015-09-02 DIAGNOSIS — M5136 Other intervertebral disc degeneration, lumbar region: Secondary | ICD-10-CM | POA: Diagnosis not present

## 2015-09-02 DIAGNOSIS — M9903 Segmental and somatic dysfunction of lumbar region: Secondary | ICD-10-CM | POA: Diagnosis not present

## 2015-09-02 DIAGNOSIS — M545 Low back pain: Secondary | ICD-10-CM | POA: Diagnosis not present

## 2015-09-04 DIAGNOSIS — M5136 Other intervertebral disc degeneration, lumbar region: Secondary | ICD-10-CM | POA: Diagnosis not present

## 2015-09-04 DIAGNOSIS — M545 Low back pain: Secondary | ICD-10-CM | POA: Diagnosis not present

## 2015-09-04 DIAGNOSIS — M47816 Spondylosis without myelopathy or radiculopathy, lumbar region: Secondary | ICD-10-CM | POA: Diagnosis not present

## 2015-09-04 DIAGNOSIS — M9903 Segmental and somatic dysfunction of lumbar region: Secondary | ICD-10-CM | POA: Diagnosis not present

## 2015-09-10 DIAGNOSIS — M545 Low back pain: Secondary | ICD-10-CM | POA: Diagnosis not present

## 2015-09-10 DIAGNOSIS — M9903 Segmental and somatic dysfunction of lumbar region: Secondary | ICD-10-CM | POA: Diagnosis not present

## 2015-09-10 DIAGNOSIS — M47816 Spondylosis without myelopathy or radiculopathy, lumbar region: Secondary | ICD-10-CM | POA: Diagnosis not present

## 2015-09-10 DIAGNOSIS — M5136 Other intervertebral disc degeneration, lumbar region: Secondary | ICD-10-CM | POA: Diagnosis not present

## 2015-09-16 DIAGNOSIS — M9903 Segmental and somatic dysfunction of lumbar region: Secondary | ICD-10-CM | POA: Diagnosis not present

## 2015-09-16 DIAGNOSIS — M5136 Other intervertebral disc degeneration, lumbar region: Secondary | ICD-10-CM | POA: Diagnosis not present

## 2015-09-16 DIAGNOSIS — M47816 Spondylosis without myelopathy or radiculopathy, lumbar region: Secondary | ICD-10-CM | POA: Diagnosis not present

## 2015-09-16 DIAGNOSIS — M545 Low back pain: Secondary | ICD-10-CM | POA: Diagnosis not present

## 2015-09-23 DIAGNOSIS — M5136 Other intervertebral disc degeneration, lumbar region: Secondary | ICD-10-CM | POA: Diagnosis not present

## 2015-09-23 DIAGNOSIS — M9903 Segmental and somatic dysfunction of lumbar region: Secondary | ICD-10-CM | POA: Diagnosis not present

## 2015-09-23 DIAGNOSIS — M545 Low back pain: Secondary | ICD-10-CM | POA: Diagnosis not present

## 2015-09-23 DIAGNOSIS — M47816 Spondylosis without myelopathy or radiculopathy, lumbar region: Secondary | ICD-10-CM | POA: Diagnosis not present

## 2015-10-07 DIAGNOSIS — M545 Low back pain: Secondary | ICD-10-CM | POA: Diagnosis not present

## 2015-10-07 DIAGNOSIS — M47816 Spondylosis without myelopathy or radiculopathy, lumbar region: Secondary | ICD-10-CM | POA: Diagnosis not present

## 2015-10-07 DIAGNOSIS — M5136 Other intervertebral disc degeneration, lumbar region: Secondary | ICD-10-CM | POA: Diagnosis not present

## 2015-10-07 DIAGNOSIS — M9903 Segmental and somatic dysfunction of lumbar region: Secondary | ICD-10-CM | POA: Diagnosis not present

## 2015-10-21 DIAGNOSIS — M545 Low back pain: Secondary | ICD-10-CM | POA: Diagnosis not present

## 2015-10-21 DIAGNOSIS — M5136 Other intervertebral disc degeneration, lumbar region: Secondary | ICD-10-CM | POA: Diagnosis not present

## 2015-10-21 DIAGNOSIS — M9903 Segmental and somatic dysfunction of lumbar region: Secondary | ICD-10-CM | POA: Diagnosis not present

## 2015-10-21 DIAGNOSIS — M47816 Spondylosis without myelopathy or radiculopathy, lumbar region: Secondary | ICD-10-CM | POA: Diagnosis not present

## 2015-11-10 DIAGNOSIS — M545 Low back pain: Secondary | ICD-10-CM | POA: Diagnosis not present

## 2015-11-10 DIAGNOSIS — M47816 Spondylosis without myelopathy or radiculopathy, lumbar region: Secondary | ICD-10-CM | POA: Diagnosis not present

## 2015-11-10 DIAGNOSIS — M5136 Other intervertebral disc degeneration, lumbar region: Secondary | ICD-10-CM | POA: Diagnosis not present

## 2015-11-10 DIAGNOSIS — M9903 Segmental and somatic dysfunction of lumbar region: Secondary | ICD-10-CM | POA: Diagnosis not present

## 2015-11-17 DIAGNOSIS — D72819 Decreased white blood cell count, unspecified: Secondary | ICD-10-CM | POA: Diagnosis not present

## 2015-11-25 DIAGNOSIS — M47816 Spondylosis without myelopathy or radiculopathy, lumbar region: Secondary | ICD-10-CM | POA: Diagnosis not present

## 2015-11-25 DIAGNOSIS — M545 Low back pain: Secondary | ICD-10-CM | POA: Diagnosis not present

## 2015-11-25 DIAGNOSIS — M9903 Segmental and somatic dysfunction of lumbar region: Secondary | ICD-10-CM | POA: Diagnosis not present

## 2015-11-25 DIAGNOSIS — M5136 Other intervertebral disc degeneration, lumbar region: Secondary | ICD-10-CM | POA: Diagnosis not present

## 2015-11-28 ENCOUNTER — Encounter (HOSPITAL_COMMUNITY): Payer: Self-pay | Admitting: Emergency Medicine

## 2015-11-28 ENCOUNTER — Emergency Department (HOSPITAL_COMMUNITY)
Admission: EM | Admit: 2015-11-28 | Discharge: 2015-11-28 | Disposition: A | Discharge status: Home / Self Care | Payer: Medicare Other | Attending: Emergency Medicine | Admitting: Emergency Medicine

## 2015-11-28 ENCOUNTER — Emergency Department (HOSPITAL_COMMUNITY): Payer: Medicare Other

## 2015-11-28 DIAGNOSIS — Z79899 Other long term (current) drug therapy: Secondary | ICD-10-CM | POA: Diagnosis not present

## 2015-11-28 DIAGNOSIS — S42212A Unspecified displaced fracture of surgical neck of left humerus, initial encounter for closed fracture: Secondary | ICD-10-CM | POA: Insufficient documentation

## 2015-11-28 DIAGNOSIS — S42292A Other displaced fracture of upper end of left humerus, initial encounter for closed fracture: Secondary | ICD-10-CM | POA: Diagnosis not present

## 2015-11-28 DIAGNOSIS — W01198A Fall on same level from slipping, tripping and stumbling with subsequent striking against other object, initial encounter: Secondary | ICD-10-CM | POA: Diagnosis not present

## 2015-11-28 DIAGNOSIS — Y999 Unspecified external cause status: Secondary | ICD-10-CM | POA: Insufficient documentation

## 2015-11-28 DIAGNOSIS — T148 Other injury of unspecified body region: Secondary | ICD-10-CM | POA: Diagnosis not present

## 2015-11-28 DIAGNOSIS — E039 Hypothyroidism, unspecified: Secondary | ICD-10-CM | POA: Insufficient documentation

## 2015-11-28 DIAGNOSIS — S4992XA Unspecified injury of left shoulder and upper arm, initial encounter: Secondary | ICD-10-CM | POA: Diagnosis present

## 2015-11-28 DIAGNOSIS — Y92017 Garden or yard in single-family (private) house as the place of occurrence of the external cause: Secondary | ICD-10-CM | POA: Insufficient documentation

## 2015-11-28 DIAGNOSIS — M25512 Pain in left shoulder: Secondary | ICD-10-CM | POA: Diagnosis not present

## 2015-11-28 DIAGNOSIS — Y9389 Activity, other specified: Secondary | ICD-10-CM | POA: Diagnosis not present

## 2015-11-28 MED ORDER — FENTANYL CITRATE (PF) 100 MCG/2ML IJ SOLN
50.0000 ug | Freq: Once | INTRAMUSCULAR | Status: AC
  Administered 2015-11-28: 50 ug via INTRAVENOUS
  Filled 2015-11-28: qty 2

## 2015-11-28 MED ORDER — FENTANYL CITRATE (PF) 100 MCG/2ML IJ SOLN
100.0000 ug | Freq: Once | INTRAMUSCULAR | Status: AC
  Administered 2015-11-28: 100 ug via INTRAVENOUS
  Filled 2015-11-28: qty 2

## 2015-11-28 MED ORDER — OXYCODONE-ACETAMINOPHEN 5-325 MG PO TABS: 0.5000 | ORAL_TABLET | ORAL | 0 refills | Status: DC | PRN

## 2015-11-28 NOTE — ED Notes (Signed)
MD at bedside. 

## 2015-11-28 NOTE — Progress Notes (Signed)
Pt confirms she fell over a stump injured left shoulder (non dominant) EDP in pt room while CM present to tell pt she has a fracture and will need sling, pain mediation, f/u with an orthopedic provider Pt with female visitor at bedside - pt states this female is her friends's husband   Pt denies need of home health and DME needs at home  Prior to ED CM leaving the female friend came into visit The female visitor attempted to get pt to consider services but the pt refused Provided pt with lists of home health agencies and PDN services

## 2015-11-28 NOTE — Progress Notes (Signed)
ED CM with [facility] in room with pt and friends to review and answered questions about HH Discussed addition of home health RN, PT aide and SW  SW to assist with facility placement if needed  Friends to be with pt during weekend Pt sitting up in bed near end of bed without issues Sling intact Friend stated pt Long term care policy would not pay for the first 90 days of private duty services

## 2015-11-28 NOTE — Progress Notes (Addendum)
ED CM returned to See pt and friends  Pt and friends choice [facility] for home services  As Cm began discussing with friends the criteria for home health to have 1) primary care giver or someone teachable available with [facility] staff arrive 2) home bound 3) primary caregiver to assist with all orders after leaving the ED, the friend reports she is leaving to go out of town on Monday and the pt will need someone with her at all times or to private pay to a facility Cm discussed that it may take more than 45 minutes to process but CM will immediately leave to call SW Discussed after asked that the SW would have to complete forms, send to facility to review to see if admissions at facility would have a bed or availability to meet pt needs and check with pt insurance  ED CM called and spoke with ED SW Erin to request pt be seen for private pay to facility  1628 EDP and ED RN updated  1633 Spoke with [facility] to call in pt Home health referral [facility] is pt choice of agency for services Still denies DME needs

## 2015-11-28 NOTE — ED Notes (Signed)
Case management at the bedside

## 2015-11-28 NOTE — ED Triage Notes (Signed)
Per EMS patient was outside at 1430-tripped over boulder and fell.  Denies LOC, reports injury to left shoulder, left upper arm.

## 2015-11-28 NOTE — ED Notes (Signed)
Patient waiting to see social worker-erin for possible placement at facility.  MD at bedside.

## 2015-11-28 NOTE — ED Notes (Signed)
Bed: [id number] Expected date: 11/28/15 Expected time:  Means of arrival:  Comments: Shoulder injury

## 2015-11-28 NOTE — Progress Notes (Signed)
Returned to pt room  EDP and ED RN present along with female and female friends who are now encouraging pt to use home services  CM reviewed in details medicare guidelines, Choices of home health [facility]) (length of stay in home, types of [facility] staff available, coverage, primary caregiver, up to 24 hrs before services may be started) and choices of Private duty nursing (PDN-coverage, length of stay in the home types of staff available).  Pt states she has a long term care policy and the female visitor wanted time to call pt's Long term care agent

## 2015-11-28 NOTE — Progress Notes (Signed)
CSW received call from ED CM Kim to speak with patient about placement. Patient requested placement for a few nights due to a fracture in her non-dominate shoulder. CSW informed patient that we do not place patients from the ED and CM could assist patient with home-health needs. Patient understood and was agreeable. CM is following up with home-health services and a Education officer, museum.   Kingsley Spittle, [place] Clinical Social Worker [phone]

## 2015-11-28 NOTE — ED Provider Notes (Signed)
[facility] DEPT Provider Note   CSN: [id number] Arrival date & time: 11/28/15  1346     History   Chief Complaint Chief Complaint  Patient presents with  . Shoulder Injury    HPI [name] Ruiz is a 70 y.o. female presenting with left shoulder pain after a fall. She was out in her yard doing work and tripped over a boulder. Landed directly on her left shoulder and felt immediate pain. Did not lose consciousness. Has no headache and does not think she had her head. No blood thinners. Pain is currently an 8/10. No weakness or numbness. No chest pain, headache, or back pain. Feels a little dizzy right after falling but now this is resolved. She thinks is related to the heat outside. Has not taken anything for the pain. She states her HR is normally in 28s.  HPI  Past Medical History:  Diagnosis Date  . Hypothyroidism   . Scoliosis     Patient Active Problem List   Diagnosis Date Noted  . Right foot pain 09/26/2010  . Pes planus 09/26/2010  . Metatarsalgia 09/26/2010    History reviewed. No pertinent surgical history.  OB History    No data available       Home Medications    Prior to Admission medications   Medication Sig Start Date End Date Taking? Authorizing Provider  ARMOUR THYROID 15 MG tablet TK 1 T PO D IN THE MORNING OES 09/09/15  Yes Historical Provider, MD  cholecalciferol (VITAMIN D) 1000 units tablet Take 1,000 Units by mouth daily.   Yes Historical Provider, MD  DIGESTIVE ENZYMES PO Take 1 capsule by mouth daily.   Yes Historical Provider, MD  Magnesium 300 MG CAPS Take 1 capsule by mouth daily.   Yes Historical Provider, MD  SELENIUM PO Take 1 tablet by mouth daily.   Yes Historical Provider, MD  Thiamine HCl (VITAMIN B-1 PO) Take 1 tablet by mouth daily.   Yes Historical Provider, MD  oxyCODONE-acetaminophen (PERCOCET) 5-325 MG tablet Take 0.5-1 tablets by mouth every 4 (four) hours as needed for severe pain. 11/28/15   Sherwood Gambler, MD     Family History Family History  Problem Relation Age of Onset  . Hypertension Mother   . Heart attack Mother   . Diabetes Neg Hx     Social History Social History  Substance Use Topics  . Smoking status: Never Smoker  . Smokeless tobacco: Never Used  . Alcohol use Not on file     Allergies   Cortisone and Seldane [terfenadine]   Review of Systems Review of Systems  Respiratory: Negative for shortness of breath.   Cardiovascular: Negative for chest pain.  Gastrointestinal: Negative for abdominal pain.  Musculoskeletal: Positive for arthralgias. Negative for back pain and neck pain.  Neurological: Positive for dizziness. Negative for weakness, numbness and headaches.  All other systems reviewed and are negative.    Physical Exam Updated Vital Signs BP 135/94 (BP Location: Right Arm)   Pulse 64   Temp 98.7 F (37.1 C) (Oral)   Resp 16   Ht 5\' 1"  (1.549 m)   Wt 103 lb (46.7 kg)   SpO2 95%   BMI 19.46 kg/m   Physical Exam  Constitutional: She is oriented to person, place, and time. She appears well-developed and well-nourished. No distress.  HENT:  Head: Normocephalic and atraumatic.  Right Ear: External ear normal.  Left Ear: External ear normal.  Nose: Nose normal.  Eyes: Right eye exhibits no  discharge. Left eye exhibits no discharge.  Neck: Neck supple.  Cardiovascular: Regular rhythm and normal heart sounds.  Bradycardia present.   Pulses:      Radial pulses are 2+ on the right side.  Pulmonary/Chest: Effort normal and breath sounds normal.  Abdominal: Soft. She exhibits no distension. There is no tenderness.  Musculoskeletal:       Left shoulder: She exhibits decreased range of motion, tenderness, bony tenderness and swelling. She exhibits normal pulse.  Normal ulnar, median and radial nerve testing in LUE.  Neurological: She is alert and oriented to person, place, and time.  Skin: Skin is warm and dry. She is not diaphoretic.  Nursing note and  vitals reviewed.    ED Treatments / Results  Labs (all labs ordered are listed, but only abnormal results are displayed) Labs Reviewed - No data to display  EKG  EKG Interpretation None       Radiology Dg Shoulder Left  Result Date: 11/28/2015 CLINICAL DATA:  Initial evaluation for acute trauma, fall. EXAM: LEFT SHOULDER - 2+ VIEW COMPARISON:  None. FINDINGS: Acute predominately transverse fracture through the neck of the left humerus with slight impaction. No significant displacement. AC joint remains approximated. Glenoid intact. Mild overlying soft tissue swelling. Visualized left hemi thorax is clear. IMPRESSION: Acute transverse fracture through the neck of the left humerus with slight impaction. Electronically Signed   By: Jeannine Boga M.D.   On: 11/28/2015 15:24    Procedures Procedures (including critical care time)  Medications Ordered in ED Medications  fentaNYL (SUBLIMAZE) injection 50 mcg (50 mcg Intravenous Given 11/28/15 1434)  fentaNYL (SUBLIMAZE) injection 100 mcg (100 mcg Intravenous Given 11/28/15 1552)     Initial Impression / Assessment and Plan / ED Course  I have reviewed the triage vital signs and the nursing notes.  Pertinent labs & imaging results that were available during my care of the patient were reviewed by me and considered in my medical decision making (see chart for details).  Clinical Course  Comment By Time  Fracture vs dislocation. Will get xray. Give IV fentanyl, keep npo, and reassess. NV intact. No head or other obvious injury. Sherwood Gambler, MD 08/18 [phone]  Fracture with impaction seen on Xray. Will get sling, refer to ortho. Will give another dose of IV fentanyl. Sherwood Gambler, MD 08/18 [phone]    Social work and case management have seen patient at her request. Will arrange home health nurse and physical therapy. Pain is much better. Will refer to orthopedics at this time she is in her rescue intact and no indication of other  significant injuries. Discharge home.  Final Clinical Impressions(s) / ED Diagnoses   Final diagnoses:  Humeral surgical neck fracture, left, closed, initial encounter    New Prescriptions Discharge Medication List as of 11/28/2015  4:39 PM    START taking these medications   Details  oxyCODONE-acetaminophen (PERCOCET) 5-325 MG tablet Take 0.5-1 tablets by mouth every 4 (four) hours as needed for severe pain., Starting Fri 11/28/2015, Print         Sherwood Gambler, MD 11/28/15 [phone]

## 2015-12-02 DIAGNOSIS — S42392A Other fracture of shaft of left humerus, initial encounter for closed fracture: Secondary | ICD-10-CM | POA: Diagnosis not present

## 2015-12-06 DIAGNOSIS — Z9181 History of falling: Secondary | ICD-10-CM | POA: Diagnosis not present

## 2015-12-06 DIAGNOSIS — S42202D Unspecified fracture of upper end of left humerus, subsequent encounter for fracture with routine healing: Secondary | ICD-10-CM | POA: Diagnosis not present

## 2015-12-08 DIAGNOSIS — S42202D Unspecified fracture of upper end of left humerus, subsequent encounter for fracture with routine healing: Secondary | ICD-10-CM | POA: Diagnosis not present

## 2015-12-08 DIAGNOSIS — Z9181 History of falling: Secondary | ICD-10-CM | POA: Diagnosis not present

## 2015-12-09 ENCOUNTER — Encounter (HOSPITAL_COMMUNITY): Payer: Self-pay | Admitting: *Deleted

## 2015-12-09 DIAGNOSIS — S42292D Other displaced fracture of upper end of left humerus, subsequent encounter for fracture with routine healing: Secondary | ICD-10-CM | POA: Diagnosis not present

## 2015-12-09 NOTE — Progress Notes (Signed)
Mrs Baumeister reports that PA instructed her that she may drink liquids until 7:00 AM, then NPO.

## 2015-12-09 NOTE — H&P (Signed)
  [name] Ruiz is an 70 y.o. female.    Chief Complaint: left shoulder pain  HPI: Pt is a 70 y.o. female complaining of left shoulder pain after recent ground level fall. Pain had continually increased since the beginning. X-rays in the clinic show displaced left proximal humerus fracture. Various options are discussed with the patient. Risks, benefits and expectations were discussed with the patient. Patient understand the risks, benefits and expectations and wishes to proceed with surgery.   PCP:  Lilian Coma, MD  D/C Plans: Home  PMH: Past Medical History:  Diagnosis Date  . Hypothyroidism   . Scoliosis     PSH: No past surgical history on file.  Social History:  reports that she has never smoked. She has never used smokeless tobacco. Her alcohol and drug histories are not on file.  Allergies:  Allergies  Allergen Reactions  . Cortisone   . Seldane [Terfenadine]   sulfa  MSG  Medications: No current facility-administered medications for this encounter.    Current Outpatient Prescriptions  Medication Sig Dispense Refill  . ARMOUR THYROID 15 MG tablet TK 1 T PO D IN THE MORNING OES  1  . cholecalciferol (VITAMIN D) 1000 units tablet Take 1,000 Units by mouth daily.    Marland Kitchen DIGESTIVE ENZYMES PO Take 1 capsule by mouth daily.    . Magnesium 300 MG CAPS Take 1 capsule by mouth daily.    Marland Kitchen oxyCODONE-acetaminophen (PERCOCET) 5-325 MG tablet Take 0.5-1 tablets by mouth every 4 (four) hours as needed for severe pain. 15 tablet 0  . SELENIUM PO Take 1 tablet by mouth daily.    . Thiamine HCl (VITAMIN B-1 PO) Take 1 tablet by mouth daily.      No results found for this or any previous visit (from the past 48 hour(s)). No results found.  ROS: Pain with rom of the left upper extremity  Physical Exam:  Alert and oriented 70 y.o. female in no acute distress Cranial nerves 2-12 intact Cervical spine: full rom with no tenderness, nv intact distally Chest: active breath  sounds bilaterally, no wheeze rhonchi or rales Heart: regular rate and rhythm, no murmur Abd: non tender non distended with active bowel sounds Hip is stable with rom  Left upper extremity with limited rom due to known fracture nv intact distally Mild edema distally in hand  Assessment/Plan Assessment: left proximal humerus fracture with displacement  Plan: Patient will undergo a left proximal humerus ORIF by Dr. Veverly Fells at [facility]. Risks benefits and expectations were discussed with the patient. Patient understand risks, benefits and expectations and wishes to proceed.

## 2015-12-10 ENCOUNTER — Observation Stay (HOSPITAL_COMMUNITY)
Admission: RE | Admit: 2015-12-10 | Discharge: 2015-12-12 | Disposition: A | Discharge status: Home Health | Payer: Medicare Other | Source: Ambulatory Visit | Attending: Orthopedic Surgery | Admitting: Orthopedic Surgery

## 2015-12-10 ENCOUNTER — Encounter (HOSPITAL_COMMUNITY): Payer: Self-pay | Admitting: *Deleted

## 2015-12-10 ENCOUNTER — Encounter (HOSPITAL_COMMUNITY)
Admission: RE | Disposition: A | Discharge status: Home Health | Payer: Self-pay | Source: Ambulatory Visit | Attending: Orthopedic Surgery

## 2015-12-10 ENCOUNTER — Inpatient Hospital Stay (HOSPITAL_COMMUNITY): Payer: Medicare Other | Admitting: Anesthesiology

## 2015-12-10 DIAGNOSIS — M25512 Pain in left shoulder: Secondary | ICD-10-CM | POA: Diagnosis not present

## 2015-12-10 DIAGNOSIS — S42292A Other displaced fracture of upper end of left humerus, initial encounter for closed fracture: Principal | ICD-10-CM | POA: Insufficient documentation

## 2015-12-10 DIAGNOSIS — W1830XA Fall on same level, unspecified, initial encounter: Secondary | ICD-10-CM | POA: Insufficient documentation

## 2015-12-10 DIAGNOSIS — E039 Hypothyroidism, unspecified: Secondary | ICD-10-CM | POA: Diagnosis not present

## 2015-12-10 DIAGNOSIS — G8918 Other acute postprocedural pain: Secondary | ICD-10-CM | POA: Diagnosis not present

## 2015-12-10 DIAGNOSIS — S42202A Unspecified fracture of upper end of left humerus, initial encounter for closed fracture: Secondary | ICD-10-CM | POA: Diagnosis not present

## 2015-12-10 DIAGNOSIS — S42209A Unspecified fracture of upper end of unspecified humerus, initial encounter for closed fracture: Secondary | ICD-10-CM | POA: Diagnosis present

## 2015-12-10 DIAGNOSIS — S42212A Unspecified displaced fracture of surgical neck of left humerus, initial encounter for closed fracture: Secondary | ICD-10-CM | POA: Diagnosis not present

## 2015-12-10 DIAGNOSIS — Z79899 Other long term (current) drug therapy: Secondary | ICD-10-CM | POA: Insufficient documentation

## 2015-12-10 HISTORY — DX: Adverse effect of other opioids, initial encounter: K59.03

## 2015-12-10 HISTORY — PX: ORIF SHOULDER FRACTURE: SHX5035

## 2015-12-10 HISTORY — DX: Unspecified osteoarthritis, unspecified site: M19.90

## 2015-12-10 HISTORY — DX: Adverse effect of other opioids, initial encounter: T40.2X5A

## 2015-12-10 LAB — CBC
HCT: 37.4 % (ref 36.0–46.0)
HEMOGLOBIN: 12.5 g/dL (ref 12.0–15.0)
MCH: 32.5 pg (ref 26.0–34.0)
MCHC: 33.4 g/dL (ref 30.0–36.0)
MCV: 97.1 fL (ref 78.0–100.0)
PLATELETS: 335 10*3/uL (ref 150–400)
RBC: 3.85 MIL/uL — ABNORMAL LOW (ref 3.87–5.11)
RDW: 12.6 % (ref 11.5–15.5)
WBC: 5.9 10*3/uL (ref 4.0–10.5)

## 2015-12-10 SURGERY — OPEN REDUCTION INTERNAL FIXATION (ORIF) SHOULDER FRACTURE
Anesthesia: General | Site: Arm Upper | Laterality: Left

## 2015-12-10 MED ORDER — VITAMIN D 1000 UNITS PO TABS
1000.0000 [IU] | ORAL_TABLET | Freq: Every day | ORAL | Status: DC
  Administered 2015-12-11 – 2015-12-12 (×2): 1000 [IU] via ORAL
  Filled 2015-12-10 (×2): qty 1

## 2015-12-10 MED ORDER — PHENOL 1.4 % MT LIQD: 1.0000 | OROMUCOSAL | Status: DC | PRN

## 2015-12-10 MED ORDER — VITAMIN B-1 100 MG PO TABS
100.0000 mg | ORAL_TABLET | Freq: Every day | ORAL | Status: DC
  Administered 2015-12-11 – 2015-12-12 (×2): 100 mg via ORAL
  Filled 2015-12-10 (×2): qty 1

## 2015-12-10 MED ORDER — ONDANSETRON HCL 4 MG/2ML IJ SOLN
INTRAMUSCULAR | Status: AC
  Filled 2015-12-10: qty 2

## 2015-12-10 MED ORDER — ACETAMINOPHEN 500 MG PO TABS: 1000.0000 mg | ORAL_TABLET | Freq: Four times a day (QID) | ORAL | Status: DC | PRN

## 2015-12-10 MED ORDER — B COMPLEX PO TABS: 1.0000 | ORAL_TABLET | Freq: Every day | ORAL | Status: DC

## 2015-12-10 MED ORDER — CEFAZOLIN SODIUM-DEXTROSE 2-4 GM/100ML-% IV SOLN
INTRAVENOUS | Status: AC
  Filled 2015-12-10: qty 100

## 2015-12-10 MED ORDER — POLYETHYLENE GLYCOL 3350 17 G PO PACK: 17.0000 g | PACK | Freq: Every day | ORAL | Status: DC | PRN

## 2015-12-10 MED ORDER — METHOCARBAMOL 1000 MG/10ML IJ SOLN
500.0000 mg | Freq: Four times a day (QID) | INTRAVENOUS | Status: DC | PRN
  Filled 2015-12-10: qty 5

## 2015-12-10 MED ORDER — FENTANYL CITRATE (PF) 100 MCG/2ML IJ SOLN
INTRAMUSCULAR | Status: AC
  Filled 2015-12-10: qty 2

## 2015-12-10 MED ORDER — KETOROLAC TROMETHAMINE 30 MG/ML IJ SOLN
INTRAMUSCULAR | Status: AC
Start: 2015-12-10 — End: 2015-12-11
  Filled 2015-12-10: qty 1

## 2015-12-10 MED ORDER — METOCLOPRAMIDE HCL 5 MG PO TABS: 5.0000 mg | ORAL_TABLET | Freq: Three times a day (TID) | ORAL | Status: DC | PRN

## 2015-12-10 MED ORDER — MAGNESIUM 200 MG PO TABS
300.0000 mg | ORAL_TABLET | Freq: Every day | ORAL | Status: DC
  Filled 2015-12-10: qty 2

## 2015-12-10 MED ORDER — OXYCODONE-ACETAMINOPHEN 5-325 MG PO TABS: 1.0000 | ORAL_TABLET | ORAL | 0 refills | Status: AC | PRN

## 2015-12-10 MED ORDER — ACETAMINOPHEN 325 MG PO TABS: 650.0000 mg | ORAL_TABLET | Freq: Four times a day (QID) | ORAL | Status: DC | PRN

## 2015-12-10 MED ORDER — 0.9 % SODIUM CHLORIDE (POUR BTL) OPTIME
TOPICAL | Status: DC | PRN
  Administered 2015-12-10: 1000 mL

## 2015-12-10 MED ORDER — BUPIVACAINE-EPINEPHRINE (PF) 0.25% -1:200000 IJ SOLN
INTRAMUSCULAR | Status: DC | PRN
  Administered 2015-12-10: 3.5 mL

## 2015-12-10 MED ORDER — ROCURONIUM BROMIDE 100 MG/10ML IV SOLN
INTRAVENOUS | Status: DC | PRN
  Administered 2015-12-10: 30 mg via INTRAVENOUS

## 2015-12-10 MED ORDER — SELENIUM 50 MCG PO TABS
100.0000 ug | ORAL_TABLET | Freq: Every day | ORAL | Status: DC
  Administered 2015-12-11 – 2015-12-12 (×2): 100 ug via ORAL
  Filled 2015-12-10 (×2): qty 2

## 2015-12-10 MED ORDER — OXYCODONE-ACETAMINOPHEN 5-325 MG PO TABS
0.5000 | ORAL_TABLET | ORAL | Status: DC | PRN
  Administered 2015-12-11 (×3): 1 via ORAL
  Filled 2015-12-10 (×3): qty 1

## 2015-12-10 MED ORDER — THYROID 30 MG PO TABS
15.0000 mg | ORAL_TABLET | Freq: Every day | ORAL | Status: DC
  Filled 2015-12-10 (×2): qty 1

## 2015-12-10 MED ORDER — PROPOFOL 10 MG/ML IV BOLUS
INTRAVENOUS | Status: DC | PRN
  Administered 2015-12-10: 150 mg via INTRAVENOUS

## 2015-12-10 MED ORDER — CHLORHEXIDINE GLUCONATE 4 % EX LIQD: 60.0000 mL | Freq: Once | CUTANEOUS | Status: DC

## 2015-12-10 MED ORDER — PROMETHAZINE HCL 25 MG/ML IJ SOLN: 6.2500 mg | INTRAMUSCULAR | Status: DC | PRN

## 2015-12-10 MED ORDER — ONDANSETRON HCL 4 MG/2ML IJ SOLN
INTRAMUSCULAR | Status: DC | PRN
  Administered 2015-12-10: 4 mg via INTRAVENOUS

## 2015-12-10 MED ORDER — OMEGA-3-ACID ETHYL ESTERS 1 G PO CAPS
1.0000 g | ORAL_CAPSULE | Freq: Every day | ORAL | Status: DC
  Administered 2015-12-11 – 2015-12-12 (×2): 1 g via ORAL
  Filled 2015-12-10 (×2): qty 1

## 2015-12-10 MED ORDER — MIDAZOLAM HCL 2 MG/2ML IJ SOLN
1.0000 mg | INTRAMUSCULAR | Status: DC | PRN
  Administered 2015-12-10: 1 mg via INTRAVENOUS

## 2015-12-10 MED ORDER — ONDANSETRON HCL 4 MG/2ML IJ SOLN: 4.0000 mg | Freq: Four times a day (QID) | INTRAMUSCULAR | Status: DC | PRN

## 2015-12-10 MED ORDER — MIDAZOLAM HCL 2 MG/2ML IJ SOLN
INTRAMUSCULAR | Status: AC
  Administered 2015-12-10: 1 mg via INTRAVENOUS
  Filled 2015-12-10: qty 2

## 2015-12-10 MED ORDER — FENTANYL CITRATE (PF) 100 MCG/2ML IJ SOLN: 25.0000 ug | INTRAMUSCULAR | Status: DC | PRN

## 2015-12-10 MED ORDER — METHOCARBAMOL 500 MG PO TABS
500.0000 mg | ORAL_TABLET | Freq: Four times a day (QID) | ORAL | Status: DC | PRN
  Administered 2015-12-10 – 2015-12-12 (×6): 500 mg via ORAL
  Filled 2015-12-10 (×6): qty 1

## 2015-12-10 MED ORDER — ACETAMINOPHEN 650 MG RE SUPP: 650.0000 mg | Freq: Four times a day (QID) | RECTAL | Status: DC | PRN

## 2015-12-10 MED ORDER — KETOROLAC TROMETHAMINE 30 MG/ML IJ SOLN
INTRAMUSCULAR | Status: AC
  Filled 2015-12-10: qty 1

## 2015-12-10 MED ORDER — MORPHINE SULFATE (PF) 2 MG/ML IV SOLN
1.0000 mg | INTRAVENOUS | Status: DC | PRN
  Administered 2015-12-11: 1 mg via INTRAVENOUS
  Filled 2015-12-10: qty 1

## 2015-12-10 MED ORDER — LIDOCAINE-EPINEPHRINE (PF) 1.5 %-1:200000 IJ SOLN
INTRAMUSCULAR | Status: DC | PRN
  Administered 2015-12-10: 10 mL via PERINEURAL

## 2015-12-10 MED ORDER — OXYCODONE HCL 5 MG/5ML PO SOLN: 5.0000 mg | Freq: Once | ORAL | Status: DC | PRN

## 2015-12-10 MED ORDER — FENTANYL CITRATE (PF) 100 MCG/2ML IJ SOLN
INTRAMUSCULAR | Status: AC
  Administered 2015-12-10: 50 ug via INTRAVENOUS
  Filled 2015-12-10: qty 2

## 2015-12-10 MED ORDER — FENTANYL CITRATE (PF) 100 MCG/2ML IJ SOLN
INTRAMUSCULAR | Status: DC | PRN
  Administered 2015-12-10: 100 ug via INTRAVENOUS

## 2015-12-10 MED ORDER — ONDANSETRON HCL 4 MG PO TABS: 4.0000 mg | ORAL_TABLET | Freq: Four times a day (QID) | ORAL | Status: DC | PRN

## 2015-12-10 MED ORDER — SALINE SPRAY 0.65 % NA SOLN
2.0000 | NASAL | Status: DC | PRN
  Filled 2015-12-10: qty 44

## 2015-12-10 MED ORDER — METHOCARBAMOL 500 MG PO TABS: 500.0000 mg | ORAL_TABLET | Freq: Three times a day (TID) | ORAL | 1 refills | Status: DC | PRN

## 2015-12-10 MED ORDER — METOCLOPRAMIDE HCL 5 MG/ML IJ SOLN: 5.0000 mg | Freq: Three times a day (TID) | INTRAMUSCULAR | Status: DC | PRN

## 2015-12-10 MED ORDER — MENTHOL 3 MG MT LOZG: 1.0000 | LOZENGE | OROMUCOSAL | Status: DC | PRN

## 2015-12-10 MED ORDER — PHENYLEPHRINE HCL 10 MG/ML IJ SOLN
INTRAVENOUS | Status: DC | PRN
  Administered 2015-12-10: 15 ug/min via INTRAVENOUS

## 2015-12-10 MED ORDER — OXYCODONE HCL 5 MG PO TABS: 5.0000 mg | ORAL_TABLET | Freq: Once | ORAL | Status: DC | PRN

## 2015-12-10 MED ORDER — SUGAMMADEX SODIUM 200 MG/2ML IV SOLN
INTRAVENOUS | Status: AC
  Filled 2015-12-10: qty 2

## 2015-12-10 MED ORDER — LIDOCAINE HCL (CARDIAC) 20 MG/ML IV SOLN
INTRAVENOUS | Status: DC | PRN
  Administered 2015-12-10: 20 mg via INTRAVENOUS

## 2015-12-10 MED ORDER — CEFAZOLIN SODIUM-DEXTROSE 2-4 GM/100ML-% IV SOLN
2.0000 g | Freq: Four times a day (QID) | INTRAVENOUS | Status: AC
  Administered 2015-12-10 – 2015-12-11 (×3): 2 g via INTRAVENOUS
  Filled 2015-12-10 (×3): qty 100

## 2015-12-10 MED ORDER — LIDOCAINE 2% (20 MG/ML) 5 ML SYRINGE
INTRAMUSCULAR | Status: AC
  Filled 2015-12-10: qty 5

## 2015-12-10 MED ORDER — BUPIVACAINE-EPINEPHRINE (PF) 0.25% -1:200000 IJ SOLN
INTRAMUSCULAR | Status: AC
  Filled 2015-12-10: qty 30

## 2015-12-10 MED ORDER — LACTATED RINGERS IV SOLN
INTRAVENOUS | Status: DC
  Administered 2015-12-10: 15:00:00 via INTRAVENOUS

## 2015-12-10 MED ORDER — FENTANYL CITRATE (PF) 100 MCG/2ML IJ SOLN
50.0000 ug | INTRAMUSCULAR | Status: DC | PRN
  Administered 2015-12-10: 50 ug via INTRAVENOUS

## 2015-12-10 MED ORDER — SODIUM CHLORIDE 0.9 % IV SOLN
INTRAVENOUS | Status: DC
  Administered 2015-12-10 – 2015-12-11 (×2): via INTRAVENOUS

## 2015-12-10 MED ORDER — KETOROLAC TROMETHAMINE 30 MG/ML IJ SOLN
30.0000 mg | Freq: Once | INTRAMUSCULAR | Status: AC | PRN
  Administered 2015-12-10: 30 mg via INTRAVENOUS

## 2015-12-10 MED ORDER — DOCUSATE SODIUM 100 MG PO CAPS
100.0000 mg | ORAL_CAPSULE | Freq: Two times a day (BID) | ORAL | Status: DC
  Administered 2015-12-10 – 2015-12-12 (×4): 100 mg via ORAL
  Filled 2015-12-10 (×4): qty 1

## 2015-12-10 MED ORDER — EPHEDRINE SULFATE 50 MG/ML IJ SOLN
INTRAMUSCULAR | Status: DC | PRN
  Administered 2015-12-10 (×2): 5 mg via INTRAVENOUS
  Administered 2015-12-10: 10 mg via INTRAVENOUS

## 2015-12-10 MED ORDER — CEFAZOLIN SODIUM-DEXTROSE 2-4 GM/100ML-% IV SOLN
2.0000 g | INTRAVENOUS | Status: AC
  Administered 2015-12-10: 2 g via INTRAVENOUS

## 2015-12-10 MED ORDER — BUPIVACAINE HCL (PF) 0.5 % IJ SOLN
INTRAMUSCULAR | Status: DC | PRN
  Administered 2015-12-10: 20 mL via PERINEURAL

## 2015-12-10 SURGICAL SUPPLY — 70 items
BIT DRILL 4 LONG FAST STEP (BIT) ×3 IMPLANT
BIT DRILL 4 SHORT FAST STEP (BIT) ×3 IMPLANT
BIT DRILL SNP 4.0MM (BIT) ×1 IMPLANT
CLOSURE WOUND 1/2 X4 (GAUZE/BANDAGES/DRESSINGS) ×1
COVER SURGICAL LIGHT HANDLE (MISCELLANEOUS) ×3 IMPLANT
DRAPE INCISE IOBAN 66X45 STRL (DRAPES) ×6 IMPLANT
DRAPE U-SHAPE 47X51 STRL (DRAPES) ×3 IMPLANT
DRILL BIT SNP 4.0MM (BIT) ×2
DRSG ADAPTIC 3X8 NADH LF (GAUZE/BANDAGES/DRESSINGS) ×3 IMPLANT
DRSG EMULSION OIL 3X3 NADH (GAUZE/BANDAGES/DRESSINGS) ×3 IMPLANT
DRSG PAD ABDOMINAL 8X10 ST (GAUZE/BANDAGES/DRESSINGS) ×3 IMPLANT
ELECT NEEDLE TIP 2.8 STRL (NEEDLE) ×3 IMPLANT
ELECT REM PT RETURN 9FT ADLT (ELECTROSURGICAL) ×3
ELECTRODE REM PT RTRN 9FT ADLT (ELECTROSURGICAL) ×1 IMPLANT
GAUZE SPONGE 4X4 12PLY STRL (GAUZE/BANDAGES/DRESSINGS) ×3 IMPLANT
GLOVE BIOGEL PI ORTHO PRO 7.5 (GLOVE) ×2
GLOVE BIOGEL PI ORTHO PRO SZ8 (GLOVE) ×2
GLOVE ORTHO TXT STRL SZ7.5 (GLOVE) ×3 IMPLANT
GLOVE PI ORTHO PRO STRL 7.5 (GLOVE) ×1 IMPLANT
GLOVE PI ORTHO PRO STRL SZ8 (GLOVE) ×1 IMPLANT
GLOVE SURG ORTHO 8.5 STRL (GLOVE) ×3 IMPLANT
GOWN STRL REUS W/ TWL LRG LVL3 (GOWN DISPOSABLE) ×2 IMPLANT
GOWN STRL REUS W/ TWL XL LVL3 (GOWN DISPOSABLE) ×2 IMPLANT
GOWN STRL REUS W/TWL LRG LVL3 (GOWN DISPOSABLE) ×4
GOWN STRL REUS W/TWL XL LVL3 (GOWN DISPOSABLE) ×4
K-WIRE 2X5 SS THRDED S3 (WIRE) ×6
KIT BASIN OR (CUSTOM PROCEDURE TRAY) ×3 IMPLANT
KIT ROOM TURNOVER OR (KITS) ×3 IMPLANT
KWIRE 2X5 SS THRDED S3 (WIRE) ×2 IMPLANT
MANIFOLD NEPTUNE II (INSTRUMENTS) ×3 IMPLANT
NEEDLE 1/2 CIR MAYO (NEEDLE) IMPLANT
NEEDLE 22X1 1/2 (OR ONLY) (NEEDLE) ×3 IMPLANT
NS IRRIG 1000ML POUR BTL (IV SOLUTION) ×3 IMPLANT
PACK SHOULDER (CUSTOM PROCEDURE TRAY) ×3 IMPLANT
PAD ARMBOARD 7.5X6 YLW CONV (MISCELLANEOUS) ×3 IMPLANT
PASSER SUT SWANSON 36MM LOOP (INSTRUMENTS) IMPLANT
PEG STND 4.0X20.0MM (Orthopedic Implant) ×6 IMPLANT
PEG STND 4.0X25.0MM (Orthopedic Implant) ×3 IMPLANT
PEG STND 4.0X32.5MM (Orthopedic Implant) ×3 IMPLANT
PEG STND 4.0X35MM (Orthopedic Implant) ×3 IMPLANT
PEG STND 4.0X42.5MM (Orthopedic Implant) ×3 IMPLANT
PEGSTD 4.0X20.0MM (Orthopedic Implant) ×2 IMPLANT
PEGSTD 4.0X32.5MM (Orthopedic Implant) ×1 IMPLANT
PEGSTD 4.0X35MM (Orthopedic Implant) ×1 IMPLANT
PEGSTD 4.0X42.5MM (Orthopedic Implant) ×1 IMPLANT
PLATE SZ3 SHOULDER NAIL SNP (Plate) ×3 IMPLANT
SCREW SNP UNICORTICAL (Screw) ×9 IMPLANT
SLING ARM FOAM STRAP MED (SOFTGOODS) ×3 IMPLANT
SLING ARM FOAM STRAP XLG (SOFTGOODS) IMPLANT
SLING ARM IMMOBILIZER LRG (SOFTGOODS) IMPLANT
SPONGE LAP 4X18 X RAY DECT (DISPOSABLE) IMPLANT
STAPLER VISISTAT 35W (STAPLE) IMPLANT
STRIP CLOSURE SKIN 1/2X4 (GAUZE/BANDAGES/DRESSINGS) ×2 IMPLANT
SUCTION FRAZIER HANDLE 10FR (MISCELLANEOUS) ×2
SUCTION TUBE FRAZIER 10FR DISP (MISCELLANEOUS) ×1 IMPLANT
SUT BONE WAX W31G (SUTURE) IMPLANT
SUT ETHIBOND NAB CT1 #1 30IN (SUTURE) IMPLANT
SUT FIBERWIRE #2 38 T-5 BLUE (SUTURE) ×3
SUT MNCRL AB 4-0 PS2 18 (SUTURE) ×3 IMPLANT
SUT VIC AB 0 CT1 27 (SUTURE) ×2
SUT VIC AB 0 CT1 27XBRD ANBCTR (SUTURE) ×1 IMPLANT
SUT VIC AB 2-0 CT1 27 (SUTURE) ×2
SUT VIC AB 2-0 CT1 TAPERPNT 27 (SUTURE) ×1 IMPLANT
SUT VICRYL 4-0 PS2 18IN ABS (SUTURE) IMPLANT
SUTURE FIBERWR #2 38 T-5 BLUE (SUTURE) ×1 IMPLANT
SYR CONTROL 10ML LL (SYRINGE) ×3 IMPLANT
TOWEL OR 17X24 6PK STRL BLUE (TOWEL DISPOSABLE) ×3 IMPLANT
TOWEL OR 17X26 10 PK STRL BLUE (TOWEL DISPOSABLE) ×3 IMPLANT
WATER STERILE IRR 1000ML POUR (IV SOLUTION) IMPLANT
YANKAUER SUCT BULB TIP NO VENT (SUCTIONS) ×3 IMPLANT

## 2015-12-10 NOTE — Anesthesia Procedure Notes (Signed)
Procedure Name: Intubation Date/Time: 12/10/2015 3:35 PM Performed by: Myna Bright Pre-anesthesia Checklist: Patient identified, Emergency Drugs available, Suction available and Patient being monitored Patient Re-evaluated:Patient Re-evaluated prior to inductionOxygen Delivery Method: Circle system utilized Preoxygenation: Pre-oxygenation with 100% oxygen Intubation Type: IV induction Ventilation: Mask ventilation without difficulty Laryngoscope Size: Mac and 3 Grade View: Grade II Tube type: Oral Tube size: 7.0 mm Number of attempts: 1 Airway Equipment and Method: Stylet Placement Confirmation: ETT inserted through vocal cords under direct vision,  positive ETCO2 and breath sounds checked- equal and bilateral Secured at: 21 cm Tube secured with: Tape Dental Injury: Teeth and Oropharynx as per pre-operative assessment

## 2015-12-10 NOTE — Anesthesia Procedure Notes (Signed)
Anesthesia Regional Block:  Interscalene brachial plexus block  Pre-Anesthetic Checklist: ,, timeout performed, Correct Patient, Correct Site, Correct Laterality, Correct Procedure, Correct Position, site marked, Risks and benefits discussed,  Surgical consent,  Pre-op evaluation,  At surgeon's request and post-op pain management  Laterality: Left  Prep: chloraprep       Needles:  Injection technique: Single-shot  Needle Type: Echogenic Needle     Needle Length: 9cm 9 cm Needle Gauge: 21 G    Additional Needles:  Procedures: ultrasound guided (picture in chart) Interscalene brachial plexus block Narrative:  Injection made incrementally with aspirations every 5 mL.  Performed by: Personally  Anesthesiologist: Teya Otterson  Additional Notes: Patient tolerated the procedure well without complications

## 2015-12-10 NOTE — Brief Op Note (Signed)
12/10/2015  5:01 PM  PATIENT:  [name] Ruiz  70 y.o. female  PRE-OPERATIVE DIAGNOSIS:  LEFT SHOULDER PROXIMAL HUMERUS FRACTURE  POST-OPERATIVE DIAGNOSIS:  LEFT SHOULDER PROXIMAL HUMERUS FRACTURE, DISPLACED, COMMINUTED  PROCEDURE:  Procedure(s): OPEN REDUCTION INTERNAL FIXATION (ORIF) LEFT  SHOULDER FRACTURE (Left) BIOMET SNP NAIL PLATE  SURGEON:  Surgeon(s) and Role:    * Netta Cedars, MD - Primary  PHYSICIAN ASSISTANT:   ASSISTANTS: Ventura Bruns, PA-C   ANESTHESIA:   regional and general  EBL:  No intake/output data recorded.  BLOOD ADMINISTERED:none  DRAINS: none   LOCAL MEDICATIONS USED:  MARCAINE     SPECIMEN:  No Specimen  DISPOSITION OF SPECIMEN:  N/A  COUNTS:  YES  TOURNIQUET:  * No tourniquets in log *  DICTATION: .Other Dictation: Dictation Number [id number]  PLAN OF CARE: Admit for overnight observation  PATIENT DISPOSITION:  PACU - hemodynamically stable.   Delay start of Pharmacological VTE agent (>24hrs) due to surgical blood loss or risk of bleeding: no

## 2015-12-10 NOTE — Progress Notes (Signed)
Patient arrived to the room from PACU at 6:35pm.  Alert and oriented. Resting in bed post L shoulder fracture repair. Block is still working. No pain or discomfort. Gauze, hypofix tape, sling, and ice pack in place. Small drainage on gauze marked. Very pleasant. 2 visitors at bedside. Nursing to continue to monitor.

## 2015-12-10 NOTE — Anesthesia Preprocedure Evaluation (Signed)
Anesthesia Evaluation  Patient identified by MRN, date of birth, ID band Patient awake    Reviewed: Allergy & Precautions, NPO status , Patient's Chart, lab work & pertinent test results  Airway Mallampati: III  TM Distance: >3 FB Neck ROM: Full    Dental no notable dental hx.    Pulmonary neg pulmonary ROS,    Pulmonary exam normal breath sounds clear to auscultation       Cardiovascular negative cardio ROS Normal cardiovascular exam Rhythm:Regular Rate:Normal     Neuro/Psych negative neurological ROS  negative psych ROS   GI/Hepatic negative GI ROS, Neg liver ROS,   Endo/Other  Hypothyroidism   Renal/GU negative Renal ROS  negative genitourinary   Musculoskeletal negative musculoskeletal ROS (+)   Abdominal   Peds negative pediatric ROS (+)  Hematology negative hematology ROS (+)   Anesthesia Other Findings   Reproductive/Obstetrics negative OB ROS                             Anesthesia Physical Anesthesia Plan  ASA: II  Anesthesia Plan: General   Post-op Pain Management: GA combined w/ Regional for post-op pain   Induction: Intravenous  Airway Management Planned: Oral ETT  Additional Equipment:   Intra-op Plan:   Post-operative Plan: Extubation in OR  Informed Consent: I have reviewed the patients History and Physical, chart, labs and discussed the procedure including the risks, benefits and alternatives for the proposed anesthesia with the patient or authorized representative who has indicated his/her understanding and acceptance.   Dental advisory given  Plan Discussed with: CRNA and Surgeon  Anesthesia Plan Comments:         Anesthesia Quick Evaluation

## 2015-12-10 NOTE — Interval H&P Note (Signed)
History and Physical Interval Note:  12/10/2015 3:23 PM  [name] Ruiz  has presented today for surgery, with the diagnosis of LEFT SHOULDER PROXIMAL HUMEROUS FRACTURE  The various methods of treatment have been discussed with the patient and family. After consideration of risks, benefits and other options for treatment, the patient has consented to  Procedure(s): OPEN REDUCTION INTERNAL FIXATION (ORIF) LEFT  SHOULDER FRACTURE (Left) as a surgical intervention .  The patient's history has been reviewed, patient examined, no change in status, stable for surgery.  I have reviewed the patient's chart and labs.  Questions were answered to the patient's satisfaction.     Sigurd Pugh,STEVEN R

## 2015-12-10 NOTE — Transfer of Care (Signed)
Immediate Anesthesia Transfer of Care Note  Patient: [name] Ruiz  Procedure(s) Performed: Procedure(s): OPEN REDUCTION INTERNAL FIXATION (ORIF) LEFT  SHOULDER FRACTURE (Left)  Patient Location: PACU  Anesthesia Type:GA combined with regional for post-op pain  Level of Consciousness: awake, alert , oriented and patient cooperative  Airway & Oxygen Therapy: Patient Spontanous Breathing and Patient connected to nasal cannula oxygen  Post-op Assessment: Report given to RN, Post -op Vital signs reviewed and stable and Patient moving all extremities  Post vital signs: Reviewed and stable  Last Vitals:  Vitals:   12/10/15 1510 12/10/15 1711  BP: (!) 158/83   Pulse: 67   Resp: 15   Temp:  (P) 36.4 C    Last Pain:  Vitals:   12/10/15 1711  TempSrc:   PainSc: (P) 0-No pain      Patients Stated Pain Goal: 4 ([id number])  Complications: No apparent anesthesia complications

## 2015-12-11 ENCOUNTER — Encounter (HOSPITAL_COMMUNITY): Payer: Self-pay | Admitting: Orthopedic Surgery

## 2015-12-11 DIAGNOSIS — M25512 Pain in left shoulder: Secondary | ICD-10-CM | POA: Diagnosis not present

## 2015-12-11 DIAGNOSIS — E039 Hypothyroidism, unspecified: Secondary | ICD-10-CM | POA: Diagnosis not present

## 2015-12-11 DIAGNOSIS — Z79899 Other long term (current) drug therapy: Secondary | ICD-10-CM | POA: Diagnosis not present

## 2015-12-11 DIAGNOSIS — S42292A Other displaced fracture of upper end of left humerus, initial encounter for closed fracture: Secondary | ICD-10-CM | POA: Diagnosis not present

## 2015-12-11 LAB — BASIC METABOLIC PANEL
ANION GAP: 11 (ref 5–15)
BUN: 12 mg/dL (ref 6–20)
CHLORIDE: 94 mmol/L — AB (ref 101–111)
CO2: 26 mmol/L (ref 22–32)
CREATININE: 0.87 mg/dL (ref 0.44–1.00)
Calcium: 9.1 mg/dL (ref 8.9–10.3)
GFR calc non Af Amer: >60 mL/min (ref >60)
Glucose, Bld: 97 mg/dL (ref 65–99)
POTASSIUM: 3.8 mmol/L (ref 3.5–5.1)
SODIUM: 131 mmol/L — AB (ref 135–145)

## 2015-12-11 LAB — HEMOGLOBIN AND HEMATOCRIT, BLOOD
HCT: 39.5 % (ref 36.0–46.0)
Hemoglobin: 13.3 g/dL (ref 12.0–15.0)

## 2015-12-11 MED ORDER — MAGNESIUM OXIDE 400 (241.3 MG) MG PO TABS
400.0000 mg | ORAL_TABLET | Freq: Every day | ORAL | Status: DC
  Administered 2015-12-11 – 2015-12-12 (×2): 400 mg via ORAL
  Filled 2015-12-11 (×2): qty 1

## 2015-12-11 MED ORDER — OXYCODONE-ACETAMINOPHEN 5-325 MG PO TABS
1.0000 | ORAL_TABLET | ORAL | Status: DC | PRN
  Administered 2015-12-11 (×2): 2 via ORAL
  Administered 2015-12-11: 1 via ORAL
  Administered 2015-12-11 – 2015-12-12 (×4): 2 via ORAL
  Filled 2015-12-11 (×3): qty 2
  Filled 2015-12-11: qty 1
  Filled 2015-12-11 (×4): qty 2

## 2015-12-11 NOTE — Anesthesia Postprocedure Evaluation (Signed)
Anesthesia Post Note  Patient: [name] Ruiz  Procedure(s) Performed: Procedure(s) (LRB): OPEN REDUCTION INTERNAL FIXATION (ORIF) LEFT  SHOULDER FRACTURE (Left)  Patient location during evaluation: PACU Anesthesia Type: General and Regional Level of consciousness: awake and alert, oriented and patient cooperative Pain management: pain level controlled Vital Signs Assessment: post-procedure vital signs reviewed and stable Respiratory status: spontaneous breathing, nonlabored ventilation and respiratory function stable Cardiovascular status: blood pressure returned to baseline and stable Postop Assessment: no signs of nausea or vomiting Anesthetic complications: no Comments: Delayed entry: pt eval in PACU 8/30     Last Vitals:  Vitals:   12/11/15 0211 12/11/15 0500  BP: 127/67 115/87  Pulse: 72 62  Resp: 18 20  Temp: 36.8 C 37.1 C    Last Pain:  Vitals:   12/11/15 0642  TempSrc:   PainSc: 6    Pain Goal: Patients Stated Pain Goal: 4 (12/10/15 1441)               Seleta Rhymes. Tylor Courtwright

## 2015-12-11 NOTE — Evaluation (Signed)
Occupational Therapy Evaluation Patient Details Name: [name] Ruiz MRN: [id number] DOB: Apr 07, 1946 Today's Date: 12/11/2015    History of Present Illness s/p L ORIF proximal humerus. PMH: significant for arthritis   Clinical Impression   Pt was receiving assist for ADL and IADL by paid caregivers and friends in the 2 weeks leading up to surgery. She presents with significant L UE pain and generalized weakness. Educated pt in positioning L UE in bed and chair, sling use, NWB status and compensatory strategies for ADL. Reinforced instruction with handout. Pt performed L elbow to hand AROM in standing x 10. Will follow acutely.    Follow Up Recommendations  No OT follow up    Equipment Recommendations  None recommended by OT    Recommendations for Other Services       Precautions / Restrictions Precautions Precautions: Shoulder Type of Shoulder Precautions: conservative protocol Shoulder Interventions: Shoulder sling/immobilizer;Off for dressing/bathing/exercises Precaution Booklet Issued: Yes (comment) Required Braces or Orthoses: Sling Restrictions Weight Bearing Restrictions: Yes LUE Weight Bearing: Non weight bearing      Mobility Bed Mobility               General bed mobility comments: pt in chair, plans to sleep in recliner at home  Transfers Overall transfer level: Needs assistance   Transfers: Sit to/from Stand Sit to Stand: Supervision         General transfer comment: for safety    Balance                                            ADL Overall ADL's : Needs assistance/impaired Eating/Feeding: Set up;Sitting   Grooming: Wash/dry hands;Wash/dry face;Sitting;Set up   Upper Body Bathing: Moderate assistance;Sitting   Lower Body Bathing: Moderate assistance;Sit to/from stand   Upper Body Dressing : Minimal assistance;Sitting   Lower Body Dressing: Minimal assistance;Sit to/from stand Lower Body Dressing Details  (indicate cue type and reason): wears velcro shoes Toilet Transfer: Supervision/safety;Ambulation;Comfort height toilet   Toileting- Clothing Manipulation and Hygiene: Supervision/safety;Sit to/from stand       Functional mobility during ADLs: Supervision/safety General ADL Comments: educated pt in positioning L UE in bed and chair, sling donning and doffing and correct positioning in sling, compensatory strategies for ADL and strict NWB precautions on L UE     Vision     Perception     Praxis      Pertinent Vitals/Pain Pain Assessment: Faces Faces Pain Scale: Hurts whole lot Pain Location: L UE Pain Descriptors / Indicators: Aching;Grimacing;Guarding Pain Intervention(s): Monitored during session;Patient requesting pain meds-RN notified;Premedicated before session;Repositioned;Ice applied;Heat applied (heat to L scapula, ice to incision)     Hand Dominance Right   Extremity/Trunk Assessment Upper Extremity Assessment Upper Extremity Assessment: LUE deficits/detail LUE Deficits / Details: performed AROM elbow, forearm, wrist, hand x 10 in standing LUE: Unable to fully assess due to immobilization LUE Coordination: decreased gross motor   Lower Extremity Assessment Lower Extremity Assessment: Overall WFL for tasks assessed       Communication Communication Communication: No difficulties   Cognition Arousal/Alertness: Awake/alert Behavior During Therapy: WFL for tasks assessed/performed Overall Cognitive Status: Within Functional Limits for tasks assessed                     General Comments       Exercises       Shoulder  Instructions      Home Living Family/patient expects to be discharged to:: Private residence Living Arrangements: Alone Available Help at Discharge: Friend(s);Personal care attendant;Available PRN/intermittently                                    Prior Functioning/Environment Level of Independence: Needs assistance   Gait / Transfers Assistance Needed: independent ADL's / Homemaking Assistance Needed: was assisted by hired aide in the two weeks leading up to the sx        OT Diagnosis: Generalized weakness;Acute pain   OT Problem List: Decreased activity tolerance;Impaired balance (sitting and/or standing);Decreased coordination;Impaired UE functional use;Pain   OT Treatment/Interventions: Self-care/ADL training;DME and/or AE instruction;Therapeutic activities;Patient/family education;Therapeutic exercise    OT Goals(Current goals can be found in the care plan section) Acute Rehab OT Goals Patient Stated Goal: decrease pain OT Goal Formulation: With patient Time For Goal Achievement: 12/18/15 Potential to Achieve Goals: Good ADL Goals Pt Will Perform Grooming: with modified independence;standing Pt Will Perform Upper Body Bathing: with modified independence;sitting Pt Will Perform Upper Body Dressing: with modified independence;sitting Pt Will Perform Lower Body Dressing: with modified independence;sit to/from stand Pt Will Transfer to Toilet: with modified independence;ambulating;regular height toilet Pt Will Perform Toileting - Clothing Manipulation and hygiene: with modified independence;sit to/from stand Pt/caregiver will Perform Home Exercise Program: Independently (L elbow, forearm, wrist hand AROM) Additional ADL Goal #1: Pt will don/doff sling independently.  OT Frequency: Min 2X/week   Barriers to D/C:            Co-evaluation              End of Session Nurse Communication: Patient requests pain meds  Activity Tolerance: Patient tolerated treatment well Patient left: in chair;with call bell/phone within reach   Time: [id number] OT Time Calculation (min): 48 min Charges:  OT General Charges $OT Visit: 1 Procedure OT Evaluation $OT Eval Moderate Complexity: 1 Procedure OT Treatments $Self Care/Home Management : 8-22 mins $Therapeutic Exercise: 8-22 mins G-Codes: OT  G-codes **NOT FOR INPATIENT CLASS** Functional Assessment Tool Used: clinical judgement Functional Limitation: Self care Self Care Current Status [id number]): At least 40 percent but less than 60 percent impaired, limited or restricted Self Care Goal Status [id number]): At least 1 percent but less than 20 percent impaired, limited or restricted  Malka So 12/11/2015, 10:49 AM  [phone]

## 2015-12-11 NOTE — Op Note (Signed)
[name], [name] NO.:  [id number]  MEDICAL RECORD NO.:  [id number]  LOCATION:  5N20C                        FACILITY:  [facility]  PHYSICIAN:  Doran Heater. Veverly Fells, M.D. DATE OF BIRTH:  [birth year]  DATE OF PROCEDURE:  12/10/2015 DATE OF DISCHARGE:                              OPERATIVE REPORT   PREOPERATIVE DIAGNOSIS:  Displaced and comminuted left proximal humerus fracture.  POSTOPERATIVE DIAGNOSIS:  Displaced and comminuted left proximal humerus fracture.  PROCEDURE PERFORMED:  Open reduction and internal fixation of left proximal humerus fracture using Biomet nail plate (SNP).  ATTENDING SURGEON:  Doran Heater. Veverly Fells, M.D.  ASSISTANT:  Abbott Pao. Dixon, PA-C, who scrubbed the entire procedure and was necessary for satisfactory completion of surgery.  ANESTHESIA:  General anesthesia was used plus interscalene block.  ESTIMATED BLOOD LOSS:  100 mL.  FLUID REPLACEMENT:  1000 mL of crystalloid.  INSTRUMENT COUNTS:  Correct.  COMPLICATIONS:  There were no complications.  ANTIBIOTICS:  Perioperative antibiotics were given.  INDICATIONS:  The patient is a 70 year old female who suffered a fall injuring her left shoulder.  The patient initially had an acceptable displacement on her proximal humerus fracture and was planned to be managed conservatively.  She followed up with Orthopedics 1 week after injury.  She has been immobilized in a sling and no subsequent injury, but the patient had complete displacement of her humeral shaft out from under humeral head, which had rotated to 90 degrees to the posterior. We discussed this with the patient, mentioned that this would go on to either malunion or nonunion more likely and ongoing pain and dysfunction.  We recommended surgical reduction and fixation with a Biomet nail plate.  The patient agreed to this and informed consent was obtained.  DESCRIPTION OF PROCEDURE:  After an adequate level of anesthesia achieved,  the patient positioned in modified beach-chair position.  Left shoulder was correctly identified, sterilely prepped and draped in usual manner.  Time-out was called.  We entered the shoulder using standard anterior deltopectoral approach.  We started at the coracoid process extending down to the anterior humerus.  Dissection was down through the subcutaneous tissues using Bovie.  We identified the cephalic vein, took it laterally with the deltoid.  Pectoralis was taken medially.  We identified the fractured humerus.  The humeral shaft was 100% displaced anterior to the proximal humeral fragment, which was rotated posteriorly.  We mobilized the fracture, able to obtain an anatomic reduction under C-arm.  We did go ahead and then placed the Biomet SNP nail plate down the humeral shaft.  We then reduced the shoulder, reduced the fracture, getting an acceptable reduction, we placed a central guidepin to verify at the height of the implant, and rotation and alignment.  Once that was confirmed, we noted there was severe comminution at the metaphyseal neck and calcar area and also around the posterior area making this a very unstable fracture.  We did go ahead and placed two proximal locked, threaded, smooth pegs, that we locked into the plate and then verified again the position of this pegs, their length and location of the implant.  We then placed our central peg and then the two inferior  pegs, which were short and likely engaging cortical bone.  We were pleased with the secure fixation proximally.  We then went ahead and placed two unicortical screws through the lateral cortex of the humerus and into the SNP implant, effectively controlling rotation.  Everything moved together nicely as a unit.  I did notice there was comminution up in the lesser tuberosity area and also the greater tuberosity, which fractured free.  So, we went ahead and grabbed #2 Hi-Fi suture and made a single pass, a  mattress pass lateral to the greater tuberosity and medial to the lesser tuberosity and tied those around some of the smooth pegs to basically get tuberosity to shaft fixation and once that was done, everything felt very very secured.  We irrigated thoroughly.  I was pleased with the reduction.  I am worried about the patient's osteopenia and osteoporosis that we just were noting during the case and also about the amount of comminution, looked to be very conservative with her rehab protocol.  We irrigated thoroughly, closed the deep layers with 0 Vicryl suture followed by 2-0 Vicryl for subcutaneous closure and 4-0 Monocryl for skin.  Steri-Strips applied followed by sterile dressing.  The patient tolerated the surgery well.     Doran Heater. Veverly Fells, M.D.     SRN/MEDQ  D:  12/10/2015  T:  12/11/2015  Job:  [id number]

## 2015-12-11 NOTE — Discharge Instructions (Signed)
Be very gentle with the left arm.  Do Not Push Pull or lift with the left arm/hand  Ice the shoulder as much as you can.  Rest the arm on a pillow in the home, out of the house and while up use the sling.    Keep the dressing in place for three days then ok to change the bandage to Gel Bandages to cover the Steri Strips/wound.  Those bandages stay on for three days each  Do Not get the wound wet for one week, then after that ok to shower and get wound wet.  Follow up with Dr Veverly Fells in two weeks  [phone]

## 2015-12-11 NOTE — Progress Notes (Signed)
Returned at pt's request to assist with repositioning and pain management. Soft tissue massage performed to area around L scapula and pillows repositioned. Reassured pt that she would not be discharged without pain being better managed. Assisted pt to bathroom for safety.  12/11/15 1048  OT Visit Information  Last OT Received On 12/11/15  Assistance Needed +1  History of Present Illness s/p L ORIF proximal humerus. PMH: significant for arthritis  Precautions  Precautions Shoulder  Type of Shoulder Precautions conservative protocol  Shoulder Interventions Shoulder sling/immobilizer;Off for dressing/bathing/exercises  Required Braces or Orthoses Sling  Pain Assessment  Pain Assessment Faces  Faces Pain Scale 8  Pain Location L shoulder  Pain Descriptors / Indicators Aching;Grimacing;Guarding  Pain Intervention(s) Monitored during session;Premedicated before session;Repositioned;Heat applied (soft tissue massage L scapula area)  Cognition  Arousal/Alertness Awake/alert  Behavior During Therapy WFL for tasks assessed/performed  Overall Cognitive Status Within Functional Limits for tasks assessed  ADL  Toilet Transfer Supervision/safety;Ambulation;Comfort height toilet  Toileting- Clothing Manipulation and Hygiene Supervision/safety;Sit to/from stand  General ADL Comments Attempted to reposition and offer alternatives for pain relief. Pt had also received IV pain meds per RN.  Restrictions  Weight Bearing Restrictions Yes  LUE Weight Bearing NWB  Transfers  Overall transfer level Needs assistance  Transfers Sit to/from Stand  Sit to Stand Supervision  General transfer comment for safety  OT - End of Session  Activity Tolerance Patient limited by pain  Patient left in chair;with call bell/phone within reach  OT Assessment/Plan  OT Plan Discharge plan remains appropriate  OT Frequency (ACUTE ONLY) Min 2X/week  Follow Up Recommendations No OT follow up  OT Equipment None recommended  by OT  OT Goal Progression  Progress towards OT goals Not progressing toward goals - comment (pain)  Acute Rehab OT Goals  Patient Stated Goal decrease pain  Time For Goal Achievement 12/18/15  Potential to Achieve Goals Good  ADL Goals  Pt Will Perform Grooming with modified independence;standing  Pt Will Perform Upper Body Bathing with modified independence;sitting  Pt Will Perform Upper Body Dressing with modified independence;sitting  Pt Will Perform Lower Body Dressing with modified independence;sit to/from stand  Pt Will Transfer to Toilet with modified independence;ambulating;regular height toilet  Pt Will Perform Toileting - Clothing Manipulation and hygiene with modified independence;sit to/from stand  Pt/caregiver will Perform Home Exercise Program Independently (L elbow, forearm, wrist hand AROM)  Additional ADL Goal #1 Pt will don/doff sling independently.  OT General Charges  $OT Visit 1 Procedure  OT Treatments  $Self Care/Home Management  8-22 mins  12/11/2015 Nestor Lewandowsky, OTR/L Pager: [phone]

## 2015-12-11 NOTE — Discharge Summary (Addendum)
Physician Discharge Summary   Patient ID: [name] Ruiz MRN: [id number] DOB/AGE: Jun 04, 1945 70 y.o.  Admit date: 12/10/2015 Discharge date: 12/11/2015  Admission Diagnoses:  Active Problems:   Proximal humerus fracture   Discharge Diagnoses:  Same   Surgeries: Procedure(s): OPEN REDUCTION INTERNAL FIXATION (ORIF) LEFT  SHOULDER FRACTURE on 12/10/2015   Consultants: OT  Discharged Condition: Stable  Hospital Course: [name] Ruiz is an 70 y.o. female who was admitted 12/10/2015 with a chief complaint of left shoulder pain, and found to have a diagnosis of left displaced proximal humerus fracture.  They were brought to the operating room on 12/10/2015 and underwent the above named procedures.    The patient had an uncomplicated hospital course and was stable for discharge.  Recent vital signs:  Vitals:   12/11/15 0211 12/11/15 0500  BP: 127/67 115/87  Pulse: 72 62  Resp: 18 20  Temp: 98.2 F (36.8 C) 98.8 F (37.1 C)    Recent laboratory studies:  Results for orders placed or performed during the hospital encounter of 12/10/15  CBC  Result Value Ref Range   WBC 5.9 4.0 - 10.5 K/uL   RBC 3.85 (L) 3.87 - 5.11 MIL/uL   Hemoglobin 12.5 12.0 - 15.0 g/dL   HCT 37.4 36.0 - 46.0 %   MCV 97.1 78.0 - 100.0 fL   MCH 32.5 26.0 - 34.0 pg   MCHC 33.4 30.0 - 36.0 g/dL   RDW 12.6 11.5 - 15.5 %   Platelets 335 150 - 400 K/uL  Hemoglobin and hematocrit, blood  Result Value Ref Range   Hemoglobin 13.3 12.0 - 15.0 g/dL   HCT 39.5 36.0 - [id number] %  Basic metabolic panel  Result Value Ref Range   Sodium 131 (L) 135 - 145 mmol/L   Potassium 3.8 3.5 - 5.1 mmol/L   Chloride 94 (L) 101 - 111 mmol/L   CO2 26 22 - 32 mmol/L   Glucose, Bld 97 65 - 99 mg/dL   BUN 12 6 - 20 mg/dL   Creatinine, Ser 0.87 0.44 - 1.00 mg/dL   Calcium 9.1 8.9 - 10.3 mg/dL   GFR calc non Af Amer >60 >60 mL/min   GFR calc Af Amer >60 >60 mL/min   Anion gap 11 5 - 15    Discharge Medications:       Medication List    TAKE these medications   acetaminophen 500 MG tablet Commonly known as:  TYLENOL Take 1,000 mg by mouth every 6 (six) hours as needed for moderate pain or headache.   ARMOUR THYROID 15 MG tablet Generic drug:  thyroid Take 15 mg by mouth once daily   b complex vitamins tablet Take 1 tablet by mouth daily.   BOSWELLIA PO Take 1 capsule by mouth 2 (two) times daily.   cholecalciferol 1000 units tablet Commonly known as:  VITAMIN D Take 1,000 Units by mouth daily.   DIGESTIVE ENZYMES PO Take 1 capsule by mouth daily.   EVENING PRIMROSE OIL PO Take 1 capsule by mouth 2 (two) times daily.   FISH OIL PO Take 1 capsule by mouth 3 (three) times daily.   Magnesium 300 MG Caps Take 300 mg by mouth daily.   methocarbamol 500 MG tablet Commonly known as:  ROBAXIN Take 1 tablet (500 mg total) by mouth 3 (three) times daily as needed.   oxyCODONE-acetaminophen 5-325 MG tablet Commonly known as:  ROXICET Take 1-2 tablets by mouth every 4 (four) hours as needed for severe  pain. What changed:  how much to take   SELENIUM PO Take 1 tablet by mouth daily.   sodium chloride 0.65 % Soln nasal spray Commonly known as:  OCEAN Place 2-3 sprays into both nostrils as needed for congestion.   TART CHERRY ADVANCED PO Take 1 capsule by mouth 3 (three) times daily.   VITAMIN B-1 PO Take 1 tablet by mouth daily.       Diagnostic Studies: Dg Shoulder Left  Result Date: 11/28/2015 CLINICAL DATA:  Initial evaluation for acute trauma, fall. EXAM: LEFT SHOULDER - 2+ VIEW COMPARISON:  None. FINDINGS: Acute predominately transverse fracture through the neck of the left humerus with slight impaction. No significant displacement. AC joint remains approximated. Glenoid intact. Mild overlying soft tissue swelling. Visualized left hemi thorax is clear. IMPRESSION: Acute transverse fracture through the neck of the left humerus with slight impaction. Electronically Signed   By:  Jeannine Boga M.D.   On: 11/28/2015 15:24    Disposition: 01-Home or Self Care    Follow-up Information    Abdalrahman Clementson,STEVEN R, MD. Call in 2 week(s).   Specialty:  Orthopedic Surgery Why:  [phone] Contact information: [address] [place] [address] [id number]            Signed: Augustin Schooling 12/11/2015, 7:33 AM   Patient remained in hospital due to pain control until 12/12/2015.  Currently pain well controlled. Discharge today after therapy with home health therapy.  Follow up in two weeks in the office.  SRN

## 2015-12-11 NOTE — Progress Notes (Signed)
Orthopedics Progress Note  Subjective: Patient with achy pain this morning  Objective:  Vitals:   12/11/15 0211 12/11/15 0500  BP: 127/67 115/87  Pulse: 72 62  Resp: 18 20  Temp: 98.2 F (36.8 C) 98.8 F (37.1 C)    General: Awake and alert  Musculoskeletal: left shoulder dressing clean and dry and intact Neurovascularly intact  Lab Results  Component Value Date   WBC 5.9 12/10/2015   HGB 13.3 12/11/2015   HCT 39.5 12/11/2015   MCV 97.1 12/10/2015   PLT 335 12/10/2015       Component Value Date/Time   NA 131 (L) 12/11/2015 0253   K 3.8 12/11/2015 0253   CL 94 (L) 12/11/2015 0253   CO2 26 12/11/2015 0253   GLUCOSE 97 12/11/2015 0253   BUN 12 12/11/2015 0253   CREATININE 0.87 12/11/2015 0253   CALCIUM 9.1 12/11/2015 0253   GFRNONAA >60 12/11/2015 0253   GFRAA >60 12/11/2015 0253    No results found for: INR, PROTIME  Assessment/Plan: POD #1 s/p Procedure(s): OPEN REDUCTION INTERNAL FIXATION (ORIF) LEFT  SHOULDER FRACTURE OT this morning, discharge early afternoon  Steven R. Veverly Fells, MD 12/11/2015 7:30 AM

## 2015-12-12 DIAGNOSIS — S42292A Other displaced fracture of upper end of left humerus, initial encounter for closed fracture: Secondary | ICD-10-CM | POA: Diagnosis not present

## 2015-12-12 DIAGNOSIS — M25512 Pain in left shoulder: Secondary | ICD-10-CM | POA: Diagnosis not present

## 2015-12-12 DIAGNOSIS — Z79899 Other long term (current) drug therapy: Secondary | ICD-10-CM | POA: Diagnosis not present

## 2015-12-12 DIAGNOSIS — E039 Hypothyroidism, unspecified: Secondary | ICD-10-CM | POA: Diagnosis not present

## 2015-12-12 NOTE — Progress Notes (Signed)
Orthopedics Progress Note  Subjective: Feeling much better today. Ready for D/C  Objective:  Vitals:   12/11/15 2008 12/12/15 0636  BP: (!) 166/77 (!) 155/72  Pulse: 74 74  Resp: 19   Temp: 98.8 F (37.1 C)     General: Awake and alert  Musculoskeletal: Dressing changed, wound benign, moving hand and wrist well Neurovascularly intact  Lab Results  Component Value Date   WBC 5.9 12/10/2015   HGB 13.3 12/11/2015   HCT 39.5 12/11/2015   MCV 97.1 12/10/2015   PLT 335 12/10/2015       Component Value Date/Time   NA 131 (L) 12/11/2015 0253   K 3.8 12/11/2015 0253   CL 94 (L) 12/11/2015 0253   CO2 26 12/11/2015 0253   GLUCOSE 97 12/11/2015 0253   BUN 12 12/11/2015 0253   CREATININE 0.87 12/11/2015 0253   CALCIUM 9.1 12/11/2015 0253   GFRNONAA >60 12/11/2015 0253   GFRAA >60 12/11/2015 0253    No results found for: INR, PROTIME  Assessment/Plan: POD #2 s/p Procedure(s): OPEN REDUCTION INTERNAL FIXATION (ORIF) LEFT  SHOULDER FRACTURE OT then D/C home Home health OT   Doran Heater. Veverly Fells, MD 12/12/2015 7:17 AM

## 2015-12-12 NOTE — Progress Notes (Signed)
Occupational Therapy Treatment Patient Details Name: [name] Ruiz MRN: [id number] DOB: 1945-08-14 Today's Date: 12/12/2015    History of present illness s/p L ORIF proximal humerus. PMH: significant for arthritis   OT comments  Pt performed ADL and sling management modified independently. Pt also completed AROM of L elbow to hand independently. She is ready for d/c.  Follow Up Recommendations  No OT follow up    Equipment Recommendations  None recommended by OT    Recommendations for Other Services      Precautions / Restrictions Precautions Precautions: Shoulder Type of Shoulder Precautions: conservative protocol Shoulder Interventions: Shoulder sling/immobilizer;Off for dressing/bathing/exercises Required Braces or Orthoses: Sling Restrictions Weight Bearing Restrictions: Yes LUE Weight Bearing: Non weight bearing       Mobility Bed Mobility Overal bed mobility: Modified Independent                Transfers Overall transfer level: Modified independent                    Balance                                   ADL       Grooming: Modified independent;Standing   Upper Body Bathing: Sitting;Modified independent       Upper Body Dressing : Modified independent;Sitting   Lower Body Dressing: Modified independent;Sit to/from stand   Toilet Transfer: Modified Independent   Toileting- Clothing Manipulation and Hygiene: Modified independent       Functional mobility during ADLs: Modified independent General ADL Comments: Donned sling independently. Performed AROM L elbow to hand x 10 in standing at mirror.      Vision                     Perception     Praxis      Cognition   Behavior During Therapy: WFL for tasks assessed/performed Overall Cognitive Status: Within Functional Limits for tasks assessed                       Extremity/Trunk Assessment               Exercises     Shoulder  Instructions       General Comments      Pertinent Vitals/ Pain       Pain Assessment: No/denies pain  Home Living                                          Prior Functioning/Environment              Frequency Min 2X/week     Progress Toward Goals  OT Goals(current goals can now be found in the care plan section)  Progress towards OT goals: Goals met/education completed, patient discharged from OT  Acute Rehab OT Goals Patient Stated Goal: home today Time For Goal Achievement: 12/18/15 Potential to Achieve Goals: Good  Plan Discharge plan remains appropriate    Co-evaluation                 End of Session Equipment Utilized During Treatment:  (sling)   Activity Tolerance Patient tolerated treatment well   Patient Left in chair;with call bell/phone within reach   Nurse Communication  Time: 6893-4068 OT Time Calculation (min): 61 min  Charges: OT General Charges $OT Visit: 1 Procedure OT Treatments $Self Care/Home Management : 38-52 mins $Therapeutic Exercise: 8-22 mins  Malka So 12/12/2015, 11:32 AM  [phone]

## 2015-12-12 NOTE — Progress Notes (Signed)
Reviewed discharge papers with Ms Jayson and medications with full understanding

## 2015-12-13 DIAGNOSIS — Z9181 History of falling: Secondary | ICD-10-CM | POA: Diagnosis not present

## 2015-12-13 DIAGNOSIS — S42202D Unspecified fracture of upper end of left humerus, subsequent encounter for fracture with routine healing: Secondary | ICD-10-CM | POA: Diagnosis not present

## 2015-12-16 DIAGNOSIS — Z9181 History of falling: Secondary | ICD-10-CM | POA: Diagnosis not present

## 2015-12-16 DIAGNOSIS — S42202D Unspecified fracture of upper end of left humerus, subsequent encounter for fracture with routine healing: Secondary | ICD-10-CM | POA: Diagnosis not present

## 2015-12-17 DIAGNOSIS — Z9181 History of falling: Secondary | ICD-10-CM | POA: Diagnosis not present

## 2015-12-17 DIAGNOSIS — S42202D Unspecified fracture of upper end of left humerus, subsequent encounter for fracture with routine healing: Secondary | ICD-10-CM | POA: Diagnosis not present

## 2015-12-18 DIAGNOSIS — S42202D Unspecified fracture of upper end of left humerus, subsequent encounter for fracture with routine healing: Secondary | ICD-10-CM | POA: Diagnosis not present

## 2015-12-18 DIAGNOSIS — Z9181 History of falling: Secondary | ICD-10-CM | POA: Diagnosis not present

## 2015-12-19 DIAGNOSIS — S42202D Unspecified fracture of upper end of left humerus, subsequent encounter for fracture with routine healing: Secondary | ICD-10-CM | POA: Diagnosis not present

## 2015-12-19 DIAGNOSIS — Z9181 History of falling: Secondary | ICD-10-CM | POA: Diagnosis not present

## 2015-12-23 DIAGNOSIS — Z4789 Encounter for other orthopedic aftercare: Secondary | ICD-10-CM | POA: Diagnosis not present

## 2015-12-23 DIAGNOSIS — S42292D Other displaced fracture of upper end of left humerus, subsequent encounter for fracture with routine healing: Secondary | ICD-10-CM | POA: Diagnosis not present

## 2015-12-24 DIAGNOSIS — Z9181 History of falling: Secondary | ICD-10-CM | POA: Diagnosis not present

## 2015-12-24 DIAGNOSIS — S42202D Unspecified fracture of upper end of left humerus, subsequent encounter for fracture with routine healing: Secondary | ICD-10-CM | POA: Diagnosis not present

## 2015-12-26 DIAGNOSIS — S42202D Unspecified fracture of upper end of left humerus, subsequent encounter for fracture with routine healing: Secondary | ICD-10-CM | POA: Diagnosis not present

## 2015-12-26 DIAGNOSIS — Z9181 History of falling: Secondary | ICD-10-CM | POA: Diagnosis not present

## 2015-12-30 DIAGNOSIS — S42202D Unspecified fracture of upper end of left humerus, subsequent encounter for fracture with routine healing: Secondary | ICD-10-CM | POA: Diagnosis not present

## 2015-12-30 DIAGNOSIS — Z9181 History of falling: Secondary | ICD-10-CM | POA: Diagnosis not present

## 2015-12-31 DIAGNOSIS — S42202D Unspecified fracture of upper end of left humerus, subsequent encounter for fracture with routine healing: Secondary | ICD-10-CM | POA: Diagnosis not present

## 2015-12-31 DIAGNOSIS — Z9181 History of falling: Secondary | ICD-10-CM | POA: Diagnosis not present

## 2016-01-02 DIAGNOSIS — Z9181 History of falling: Secondary | ICD-10-CM | POA: Diagnosis not present

## 2016-01-02 DIAGNOSIS — S42202D Unspecified fracture of upper end of left humerus, subsequent encounter for fracture with routine healing: Secondary | ICD-10-CM | POA: Diagnosis not present

## 2016-01-05 DIAGNOSIS — Z9181 History of falling: Secondary | ICD-10-CM | POA: Diagnosis not present

## 2016-01-05 DIAGNOSIS — S42202D Unspecified fracture of upper end of left humerus, subsequent encounter for fracture with routine healing: Secondary | ICD-10-CM | POA: Diagnosis not present

## 2016-01-06 DIAGNOSIS — S42202D Unspecified fracture of upper end of left humerus, subsequent encounter for fracture with routine healing: Secondary | ICD-10-CM | POA: Diagnosis not present

## 2016-01-06 DIAGNOSIS — Z9181 History of falling: Secondary | ICD-10-CM | POA: Diagnosis not present

## 2016-01-08 DIAGNOSIS — S42202D Unspecified fracture of upper end of left humerus, subsequent encounter for fracture with routine healing: Secondary | ICD-10-CM | POA: Diagnosis not present

## 2016-01-08 DIAGNOSIS — Z9181 History of falling: Secondary | ICD-10-CM | POA: Diagnosis not present

## 2016-01-09 DIAGNOSIS — Z9181 History of falling: Secondary | ICD-10-CM | POA: Diagnosis not present

## 2016-01-09 DIAGNOSIS — S42202D Unspecified fracture of upper end of left humerus, subsequent encounter for fracture with routine healing: Secondary | ICD-10-CM | POA: Diagnosis not present

## 2016-01-12 DIAGNOSIS — S42202D Unspecified fracture of upper end of left humerus, subsequent encounter for fracture with routine healing: Secondary | ICD-10-CM | POA: Diagnosis not present

## 2016-01-12 DIAGNOSIS — Z9181 History of falling: Secondary | ICD-10-CM | POA: Diagnosis not present

## 2016-01-13 DIAGNOSIS — Z9181 History of falling: Secondary | ICD-10-CM | POA: Diagnosis not present

## 2016-01-13 DIAGNOSIS — S42202D Unspecified fracture of upper end of left humerus, subsequent encounter for fracture with routine healing: Secondary | ICD-10-CM | POA: Diagnosis not present

## 2016-01-14 DIAGNOSIS — Z9181 History of falling: Secondary | ICD-10-CM | POA: Diagnosis not present

## 2016-01-14 DIAGNOSIS — S42202D Unspecified fracture of upper end of left humerus, subsequent encounter for fracture with routine healing: Secondary | ICD-10-CM | POA: Diagnosis not present

## 2016-01-16 DIAGNOSIS — S42202D Unspecified fracture of upper end of left humerus, subsequent encounter for fracture with routine healing: Secondary | ICD-10-CM | POA: Diagnosis not present

## 2016-01-16 DIAGNOSIS — Z9181 History of falling: Secondary | ICD-10-CM | POA: Diagnosis not present

## 2016-01-20 DIAGNOSIS — S42292D Other displaced fracture of upper end of left humerus, subsequent encounter for fracture with routine healing: Secondary | ICD-10-CM | POA: Diagnosis not present

## 2016-01-20 DIAGNOSIS — Z4789 Encounter for other orthopedic aftercare: Secondary | ICD-10-CM | POA: Diagnosis not present

## 2016-01-21 DIAGNOSIS — Z9181 History of falling: Secondary | ICD-10-CM | POA: Diagnosis not present

## 2016-01-21 DIAGNOSIS — S42202D Unspecified fracture of upper end of left humerus, subsequent encounter for fracture with routine healing: Secondary | ICD-10-CM | POA: Diagnosis not present

## 2016-01-22 DIAGNOSIS — S42202D Unspecified fracture of upper end of left humerus, subsequent encounter for fracture with routine healing: Secondary | ICD-10-CM | POA: Diagnosis not present

## 2016-01-22 DIAGNOSIS — Z9181 History of falling: Secondary | ICD-10-CM | POA: Diagnosis not present

## 2016-01-23 DIAGNOSIS — S42202D Unspecified fracture of upper end of left humerus, subsequent encounter for fracture with routine healing: Secondary | ICD-10-CM | POA: Diagnosis not present

## 2016-01-23 DIAGNOSIS — Z9181 History of falling: Secondary | ICD-10-CM | POA: Diagnosis not present

## 2016-01-26 DIAGNOSIS — S42202D Unspecified fracture of upper end of left humerus, subsequent encounter for fracture with routine healing: Secondary | ICD-10-CM | POA: Diagnosis not present

## 2016-01-26 DIAGNOSIS — Z9181 History of falling: Secondary | ICD-10-CM | POA: Diagnosis not present

## 2016-01-27 DIAGNOSIS — Z9181 History of falling: Secondary | ICD-10-CM | POA: Diagnosis not present

## 2016-01-27 DIAGNOSIS — S42202D Unspecified fracture of upper end of left humerus, subsequent encounter for fracture with routine healing: Secondary | ICD-10-CM | POA: Diagnosis not present

## 2016-01-29 DIAGNOSIS — Z9181 History of falling: Secondary | ICD-10-CM | POA: Diagnosis not present

## 2016-01-29 DIAGNOSIS — S42202D Unspecified fracture of upper end of left humerus, subsequent encounter for fracture with routine healing: Secondary | ICD-10-CM | POA: Diagnosis not present

## 2016-01-30 DIAGNOSIS — Z9181 History of falling: Secondary | ICD-10-CM | POA: Diagnosis not present

## 2016-01-30 DIAGNOSIS — S42202D Unspecified fracture of upper end of left humerus, subsequent encounter for fracture with routine healing: Secondary | ICD-10-CM | POA: Diagnosis not present

## 2016-02-02 DIAGNOSIS — Z9181 History of falling: Secondary | ICD-10-CM | POA: Diagnosis not present

## 2016-02-02 DIAGNOSIS — S42202D Unspecified fracture of upper end of left humerus, subsequent encounter for fracture with routine healing: Secondary | ICD-10-CM | POA: Diagnosis not present

## 2016-02-03 DIAGNOSIS — S42202D Unspecified fracture of upper end of left humerus, subsequent encounter for fracture with routine healing: Secondary | ICD-10-CM | POA: Diagnosis not present

## 2016-02-03 DIAGNOSIS — Z9181 History of falling: Secondary | ICD-10-CM | POA: Diagnosis not present

## 2016-02-04 DIAGNOSIS — Z9181 History of falling: Secondary | ICD-10-CM | POA: Diagnosis not present

## 2016-02-04 DIAGNOSIS — S42202D Unspecified fracture of upper end of left humerus, subsequent encounter for fracture with routine healing: Secondary | ICD-10-CM | POA: Diagnosis not present

## 2016-02-06 DIAGNOSIS — Z9181 History of falling: Secondary | ICD-10-CM | POA: Diagnosis not present

## 2016-02-06 DIAGNOSIS — S42202D Unspecified fracture of upper end of left humerus, subsequent encounter for fracture with routine healing: Secondary | ICD-10-CM | POA: Diagnosis not present

## 2016-02-09 DIAGNOSIS — Z9181 History of falling: Secondary | ICD-10-CM | POA: Diagnosis not present

## 2016-02-09 DIAGNOSIS — S42202D Unspecified fracture of upper end of left humerus, subsequent encounter for fracture with routine healing: Secondary | ICD-10-CM | POA: Diagnosis not present

## 2016-02-11 DIAGNOSIS — S42202D Unspecified fracture of upper end of left humerus, subsequent encounter for fracture with routine healing: Secondary | ICD-10-CM | POA: Diagnosis not present

## 2016-02-11 DIAGNOSIS — Z9181 History of falling: Secondary | ICD-10-CM | POA: Diagnosis not present

## 2016-02-12 DIAGNOSIS — S42202D Unspecified fracture of upper end of left humerus, subsequent encounter for fracture with routine healing: Secondary | ICD-10-CM | POA: Diagnosis not present

## 2016-02-12 DIAGNOSIS — Z9181 History of falling: Secondary | ICD-10-CM | POA: Diagnosis not present

## 2016-02-16 DIAGNOSIS — S42202D Unspecified fracture of upper end of left humerus, subsequent encounter for fracture with routine healing: Secondary | ICD-10-CM | POA: Diagnosis not present

## 2016-02-16 DIAGNOSIS — Z9181 History of falling: Secondary | ICD-10-CM | POA: Diagnosis not present

## 2016-02-17 DIAGNOSIS — Z4789 Encounter for other orthopedic aftercare: Secondary | ICD-10-CM | POA: Diagnosis not present

## 2016-02-17 DIAGNOSIS — S42292D Other displaced fracture of upper end of left humerus, subsequent encounter for fracture with routine healing: Secondary | ICD-10-CM | POA: Diagnosis not present

## 2016-02-18 DIAGNOSIS — Z9181 History of falling: Secondary | ICD-10-CM | POA: Diagnosis not present

## 2016-02-18 DIAGNOSIS — S42202D Unspecified fracture of upper end of left humerus, subsequent encounter for fracture with routine healing: Secondary | ICD-10-CM | POA: Diagnosis not present

## 2016-02-20 DIAGNOSIS — Z9181 History of falling: Secondary | ICD-10-CM | POA: Diagnosis not present

## 2016-02-20 DIAGNOSIS — S42202D Unspecified fracture of upper end of left humerus, subsequent encounter for fracture with routine healing: Secondary | ICD-10-CM | POA: Diagnosis not present

## 2016-02-23 DIAGNOSIS — Z9181 History of falling: Secondary | ICD-10-CM | POA: Diagnosis not present

## 2016-02-23 DIAGNOSIS — S42202D Unspecified fracture of upper end of left humerus, subsequent encounter for fracture with routine healing: Secondary | ICD-10-CM | POA: Diagnosis not present

## 2016-02-25 DIAGNOSIS — Z9181 History of falling: Secondary | ICD-10-CM | POA: Diagnosis not present

## 2016-02-25 DIAGNOSIS — S42202D Unspecified fracture of upper end of left humerus, subsequent encounter for fracture with routine healing: Secondary | ICD-10-CM | POA: Diagnosis not present

## 2016-02-27 DIAGNOSIS — S42202D Unspecified fracture of upper end of left humerus, subsequent encounter for fracture with routine healing: Secondary | ICD-10-CM | POA: Diagnosis not present

## 2016-02-27 DIAGNOSIS — Z9181 History of falling: Secondary | ICD-10-CM | POA: Diagnosis not present

## 2016-03-11 ENCOUNTER — Ambulatory Visit: Payer: Medicare Other | Attending: Orthopedic Surgery | Admitting: Physical Therapy

## 2016-03-11 ENCOUNTER — Encounter: Payer: Self-pay | Admitting: Physical Therapy

## 2016-03-11 DIAGNOSIS — M25512 Pain in left shoulder: Secondary | ICD-10-CM | POA: Insufficient documentation

## 2016-03-11 DIAGNOSIS — M25612 Stiffness of left shoulder, not elsewhere classified: Secondary | ICD-10-CM | POA: Diagnosis not present

## 2016-03-11 DIAGNOSIS — M6281 Muscle weakness (generalized): Secondary | ICD-10-CM | POA: Diagnosis not present

## 2016-03-11 NOTE — Therapy (Signed)
[facility] [address], [place], [place], [address] Phone: [phone]   Fax:  [phone]  Physical Therapy Evaluation  Patient Details  Name: [name] Ruiz MRN: [id number] Date of Birth: 18-Dec-1945 Referring Provider: Netta Cedars, MD  Encounter Date: 03/11/2016      PT End of Session - 03/11/16 1440    Visit Number 1   Number of Visits 10   Date for PT Re-Evaluation 05/06/16   PT Start Time 1440   PT Stop Time 1525   PT Time Calculation (min) 45 min   Activity Tolerance Patient tolerated treatment well   Behavior During Therapy [facility] for tasks assessed/performed      Past Medical History:  Diagnosis Date  . Arthritis   . Constipation due to opioid therapy   . Hypothyroidism   . Scoliosis     Past Surgical History:  Procedure Laterality Date  . Jaw wired     12/09/15- 25 years ago  . ORIF SHOULDER FRACTURE Left 12/10/2015   Procedure: OPEN REDUCTION INTERNAL FIXATION (ORIF) LEFT  SHOULDER FRACTURE;  Surgeon: Netta Cedars, MD;  Location: [place];  Service: Orthopedics;  Laterality: Left;  . Orthodontia     with jaw fracture    There were no vitals filed for this visit.       Subjective Assessment - 03/11/16 1448    Subjective Pt states she is not having much pain, but she feels it. States her doctor told her to only lift 1lb and no abduction at this time.  States she is going to see him next week for update.     Limitations House hold activities;Lifting   Patient Stated Goals reaching seat belt, get back to doing yoga   Currently in Pain? Yes   Pain Score 2    Pain Location Shoulder   Pain Orientation Left   Pain Type Surgical pain   Pain Onset More than a month ago   Pain Frequency Intermittent   Aggravating Factors  moving it around   Pain Relieving Factors ointment "super white stuff"   Effect of Pain on Daily Activities carrying groceries, reaching seatbelt, yoga   Multiple Pain Sites No             [facility] PT Assessment - 03/11/16 0001      Assessment   Medical Diagnosis s/p ORIF Lt shoulder   Referring Provider Netta Cedars, MD   Onset Date/Surgical Date 12/10/15   Hand Dominance Right   Next MD Visit 03/17/16   Prior Therapy No     Precautions   Precautions Shoulder   Type of Shoulder Precautions only lift 1lb, no abduction     Restrictions   Weight Bearing Restrictions No     Balance Screen   Has the patient fallen in the past 6 months Yes  1   How many times? 1   Has the patient had a decrease in activity level because of a fear of falling?  No   Is the patient reluctant to leave their home because of a fear of falling?  No     Home Ecologist residence     Prior Function   Level of Independence Independent   Vocation Retired     Associate Professor   Overall Cognitive Status Within Functional Limits for tasks assessed     Observation/Other Assessments   Focus on Therapeutic Outcomes (FOTO)  52% limitation     AROM  Right Shoulder Extension 68 Degrees   Right Shoulder Flexion 145 Degrees   Right Shoulder Internal Rotation --  T4   Left Shoulder Extension 40 Degrees   Left Shoulder Flexion 118 Degrees   Left Shoulder Internal Rotation --  S1   Left Shoulder External Rotation 30 Degrees     Strength   Strength Assessment Site Shoulder   Right/Left Shoulder Right;Left   Right Shoulder Flexion 5/5   Right Shoulder Extension 5/5   Right Shoulder Internal Rotation 5/5   Right Shoulder External Rotation 5/5   Left Shoulder Flexion 4-/5   Left Shoulder Extension 4+/5   Left Shoulder ABduction 2/5   Left Shoulder Internal Rotation 4+/5   Left Shoulder External Rotation 4+/5     Palpation   Palpation comment Lt GH joint is hypobile, pecs, upper trap tight                   [facility] Adult PT Treatment/Exercise - 03/11/16 0001      Shoulder Exercises: ROM/Strengthening   Ball on Wall circles two ways - 20x  each   Other ROM/Strengthening Exercises wall slided flexion - 10x   Other ROM/Strengthening Exercises towel stretch internal rotation - 20s x 3     Shoulder Exercises: Isometric Strengthening   Flexion 5X5"   Extension 5X5"   External Rotation 5X5"   Internal Rotation 5X5"   ABduction 5X5"                PT Education - 03/11/16 1537    Education provided Yes   Education Details performed and educated   Person(s) Educated Patient   Methods Explanation;Handout   Comprehension Verbalized understanding          PT Short Term Goals - 03/11/16 2207      PT SHORT TERM GOAL #1   Title independent with initial HEP   Time 4   Period Weeks   Status New     PT SHORT TERM GOAL #2   Title Lt shoulder AROM flexion 120 degrees for improved overhead reaching   Time 4   Period Weeks   Status New     PT SHORT TERM GOAL #3   Title Lt shoulder external rotation 60 degrees for improved reaching back to put seat belt on   Time 4   Period Weeks   Status New           PT Long Term Goals - 03/11/16 2152      PT LONG TERM GOAL #1   Title  FOTO < or = to 33% limitation   Baseline 52% limitation   Time 8   Period Weeks   Status New     PT LONG TERM GOAL #2   Title Able to reach back to get seat belt without pain   Time 8   Period Weeks   Status New     PT LONG TERM GOAL #3   Title Able to return to yoga   Time 8   Period Weeks   Status New     PT LONG TERM GOAL #4   Title Lt shoulder flexion 145 degrees without increased pain for improved reaching overhead   Time 8   Period Weeks   Status New     PT LONG TERM GOAL #5   Title Pt MMT 4/5 Lt shoulder abduction for improved shoulder stability for safe functional reaching overhead.   Time 8   Period Weeks   Status New  Plan - 03/11/16 1521    Clinical Impression Statement Pt received low complexity eval due to stable condition and only one body part needing to be addressed.  Lt shoulder  ROM 118 deg flex, 40 deg ext, 30 deg external rotation in supine, internal rotation to sacrum in standing with pt unable to perform abduction as per her doctor's recommendations.  Pt will see doctor next week for update to precautions.  Pt reports some pain and demonstrates fatigue with exercises performed during today's treatment.  Pt currently unable to perform functional tasks such as reaching for her seat belt and reaching behind her.  Pt has shoulder weakness of grossly 3/5 Lt shoulder.  Pt will need skilled PT to address strength and ROM deficits in order to return patient to full function including lifting objects, reaching behind back as well as return patient back to recreational hobbies like yogo as part of healthy lifestyle.   Rehab Potential Excellent   PT Frequency 1x / week   PT Duration 8 weeks   PT Treatment/Interventions Moist Heat;Cryotherapy;Therapeutic exercise;Therapeutic activities;Manual techniques;Patient/family education;Passive range of motion;Electrical Stimulation;Taping;Vasopneumatic Device;Dry needling;Functional mobility training;Neuromuscular re-education;Scar mobilization   PT Next Visit Plan ROM and strengthening to tolerance, add to HEP as needed   Recommended Other Services none   Consulted and Agree with Plan of Care Patient      Patient will benefit from skilled therapeutic intervention in order to improve the following deficits and impairments:  Decreased endurance, Decreased mobility, Decreased range of motion, Decreased strength, Postural dysfunction, Hypomobility, Increased muscle spasms, Pain, Impaired UE functional use  Visit Diagnosis: Acute pain of left shoulder - Plan: PT plan of care cert/re-cert  Stiffness of left shoulder, not elsewhere classified - Plan: PT plan of care cert/re-cert  Muscle weakness (generalized) - Plan: PT plan of care cert/re-cert      G-Codes - [id number] 07-30-2228    Functional Assessment Tool Used FOTO and clinical reasoning    Functional Limitation Carrying, moving and handling objects   Carrying, Moving and Handling Objects Current Status [phone]) At least 40 percent but less than 60 percent impaired, limited or restricted   Carrying, Moving and Handling Objects Goal Status [id number]) At least 20 percent but less than 40 percent impaired, limited or restricted       Problem List Patient Active Problem List   Diagnosis Date Noted  . Proximal humerus fracture 12/10/2015  . Right foot pain 09/26/2010  . Pes planus 09/26/2010  . Metatarsalgia 09/26/2010    Zannie Cove, PT 03/11/2016, 10:39 PM  [facility] [address] [place], [place], [address] Phone: [phone]   Fax:  [phone]  Name: [name] Ruiz MRN: [id number] Date of Birth: 24-Sep-1945

## 2016-03-11 NOTE — Patient Instructions (Signed)
Extension (Isometric)    Place left bent elbow and back of arm against wall. Press elbow against wall. Hold ____ seconds. Repeat ____ times. Do ____ sessions per day.  http://gt2.exer.us/112   Copyright  VHI. All rights reserved.  External Rotation (Isometric)    Place back of left fist against door frame, with elbow bent. Press fist against door frame. Hold ____ seconds. Repeat ____ times. Do ____ sessions per day.  http://gt2.exer.us/110   Copyright  VHI. All rights reserved.  Flexion (Isometric)    Press right fist against wall. Hold ____ seconds. Repeat ____ times. Do ____ sessions per day.  http://gt2.exer.us/114   Copyright  VHI. All rights reserved.  Strengthening: Isometric Internal Rotation    Using door frame for resistance, press palm of right hand into ball using light pressure. Keep elbow in at side. Hold ____ seconds. Repeat ____ times per set. Do ____ sets per session. Do ____ sessions per day.  http://orth.exer.us/817   Copyright  VHI. All rights reserved.  Strengthening: Isometric Abduction    Using wall for resistance, press left arm into ball using light pressure. Hold ____ seconds. Repeat ____ times per set. Do ____ sets per session. Do ____ sessions per day.  http://orth.exer.us/807   Copyright  VHI. All rights reserved.  Gymball on Wall: Clockwise / Counterclockwise    Stand ___ feet from wall with right hand supporting a ball on the wall. Lean into ball and move ball in circles clockwise. Circle ___ times. Repeat with other arm for set. Rest ___ seconds after set. Do ___ sets per session.  http://plyo.exer.us/180   Copyright  VHI. All rights reserved. SHOULDER: Flexion At Wall    Slide both arms up wall while leaning gently into wall. Maintain upright posture and tuck in stomach. Hold ___ seconds. ___ reps per set, ___ sets per day, ___ days per week  Copyright  VHI. All rights reserved.   [facility] [address] [place], [place] [address] Phone # [phone] Fax [phone]  Zannie Cove, PT 03/11/16 3:18 PM

## 2016-03-17 DIAGNOSIS — S42295D Other nondisplaced fracture of upper end of left humerus, subsequent encounter for fracture with routine healing: Secondary | ICD-10-CM | POA: Diagnosis not present

## 2016-03-17 DIAGNOSIS — Z4789 Encounter for other orthopedic aftercare: Secondary | ICD-10-CM | POA: Diagnosis not present

## 2016-03-18 ENCOUNTER — Encounter: Payer: Self-pay | Admitting: Physical Therapy

## 2016-03-18 ENCOUNTER — Ambulatory Visit: Payer: Medicare Other | Attending: Orthopedic Surgery | Admitting: Physical Therapy

## 2016-03-18 DIAGNOSIS — M25612 Stiffness of left shoulder, not elsewhere classified: Secondary | ICD-10-CM | POA: Diagnosis not present

## 2016-03-18 DIAGNOSIS — M25512 Pain in left shoulder: Secondary | ICD-10-CM | POA: Insufficient documentation

## 2016-03-18 DIAGNOSIS — M6281 Muscle weakness (generalized): Secondary | ICD-10-CM | POA: Insufficient documentation

## 2016-03-18 NOTE — Therapy (Signed)
[facility] [address] [place], [place], [address] Phone: [phone]   Fax:  [phone]  Physical Therapy Treatment  Patient Details  Name: [name] Ruiz MRN: [id number] Date of Birth: May 08, 1945 Referring Provider: Netta Cedars, MD  Encounter Date: 03/18/2016      PT End of Session - 03/18/16 1611    Visit Number 2   Number of Visits 10   Date for PT Re-Evaluation 05/06/16   PT Start Time [id number]   PT Stop Time 1610   PT Time Calculation (min) 40 min   Activity Tolerance Patient tolerated treatment well   Behavior During Therapy [facility] for tasks assessed/performed      Past Medical History:  Diagnosis Date  . Arthritis   . Constipation due to opioid therapy   . Hypothyroidism   . Scoliosis     Past Surgical History:  Procedure Laterality Date  . Jaw wired     12/09/15- 25 years ago  . ORIF SHOULDER FRACTURE Left 12/10/2015   Procedure: OPEN REDUCTION INTERNAL FIXATION (ORIF) LEFT  SHOULDER FRACTURE;  Surgeon: Netta Cedars, MD;  Location: [place];  Service: Orthopedics;  Laterality: Left;  . Orthodontia     with jaw fracture    There were no vitals filed for this visit.      Subjective Assessment - 03/18/16 1530    Subjective I saw the doctor and he is happy with my progress. He said i should resume activity.    Limitations House hold activities;Lifting   Patient Stated Goals reaching seat belt, get back to doing yoga   Currently in Pain? Yes   Pain Score 2    Pain Location Shoulder   Pain Orientation Left   Pain Descriptors / Indicators Aching   Pain Type Surgical pain   Pain Onset More than a month ago   Pain Frequency Intermittent   Aggravating Factors  moving it around   Pain Relieving Factors ointment    Effect of Pain on Daily Activities carrying groceries, reaching seatbelt, yoga   Multiple Pain Sites No            [facility] PT Assessment - 03/18/16 0001      ROM / Strength   AROM  / PROM / Strength PROM     PROM   PROM Assessment Site Shoulder   Right/Left Shoulder Left   Left Shoulder Flexion 120 Degrees   Left Shoulder ABduction 55 Degrees     1550 spoke to [employer] at Dr. Veverly Fells office and he reported patient has not left shoulder restrictions and just be gentle with abduction.  Earlie Counts, PT 03/18/16 4:31 PM                  [facility] Adult PT Treatment/Exercise - 03/18/16 0001      Shoulder Exercises: Supine   Other Supine Exercises hold 1 min at 90 degrees flexion  5 times     Shoulder Exercises: Pulleys   Flexion 2 minutes   Flexion Limitations working on slow motion     Shoulder Exercises: ROM/Strengthening   Rhythmic Stabilization, Seated stand, push left hand into ball with scapula depression 10x, move ball side to side 30x, forward backward 30x   Other ROM/Strengthening Exercises wall walk 10x to #18   Other ROM/Strengthening Exercises towel stretch internal rotation - 20s x 3     Manual Therapy   Manual Therapy Passive ROM  PT Education - 03/18/16 1611    Education provided No          PT Short Term Goals - 03/18/16 1627      PT SHORT TERM GOAL #1   Title independent with initial HEP   Time 4   Period Weeks   Status Achieved     PT SHORT TERM GOAL #3   Title Lt shoulder external rotation 60 degrees for improved reaching back to put seat belt on   Time 4   Period Weeks   Status On-going           PT Long Term Goals - 03/11/16 2152      PT LONG TERM GOAL #1   Title  FOTO < or = to 33% limitation   Baseline 52% limitation   Time 8   Period Weeks   Status New     PT LONG TERM GOAL #2   Title Able to reach back to get seat belt without pain   Time 8   Period Weeks   Status New     PT LONG TERM GOAL #3   Title Able to return to yoga   Time 8   Period Weeks   Status New     PT LONG TERM GOAL #4   Title Lt shoulder flexion 145 degrees without increased pain for improved  reaching overhead   Time 8   Period Weeks   Status New     PT LONG TERM GOAL #5   Title Pt MMT 4/5 Lt shoulder abduction for improved shoulder stability for safe functional reaching overhead.   Time 8   Period Weeks   Status New               Plan - 03/18/16 1614    Clinical Impression Statement Patient is progressing with strength.  She saw the doctor yesterday and healing well. Spoke to Dr. Veverly Fells office and they reported she has no restrictions but be gentle with abduction to her tolerance. Pateint has increased abduction with abduction.  Patient is now able to fix her hair.  Patient will benefit from skilled therapy to improve ROM and strength of left shoulder.    Rehab Potential Excellent   Clinical Impairments Affecting Rehab Potential be careful with abduction   PT Frequency 1x / week   PT Duration 8 weeks   PT Treatment/Interventions Moist Heat;Cryotherapy;Therapeutic exercise;Therapeutic activities;Manual techniques;Patient/family education;Passive range of motion;Electrical Stimulation;Taping;Vasopneumatic Device;Dry needling;Functional mobility training;Neuromuscular re-education;Scar mobilization   PT Next Visit Plan ROM and strengthening to tolerance, add to HEP for shoulder abduction   PT Home Exercise Plan shoulder abduction with no weight   Consulted and Agree with Plan of Care Patient      Patient will benefit from skilled therapeutic intervention in order to improve the following deficits and impairments:  Decreased endurance, Decreased mobility, Decreased range of motion, Decreased strength, Postural dysfunction, Hypomobility, Increased muscle spasms, Pain, Impaired UE functional use  Visit Diagnosis: Acute pain of left shoulder  Stiffness of left shoulder, not elsewhere classified  Muscle weakness (generalized)     Problem List Patient Active Problem List   Diagnosis Date Noted  . Proximal humerus fracture 12/10/2015  . Right foot pain 09/26/2010   . Pes planus 09/26/2010  . Metatarsalgia 09/26/2010    Earlie Counts, PT 03/18/16 4:29 PM   [facility] [address] [place], [place], [address] Phone: [phone]   Fax:  [phone]  Name:  [name] Ruiz MRN: [id number] Date of Birth: 02/28/1946

## 2016-03-25 ENCOUNTER — Encounter: Payer: Medicare Other | Admitting: Physical Therapy

## 2016-03-25 DIAGNOSIS — M5030 Other cervical disc degeneration, unspecified cervical region: Secondary | ICD-10-CM | POA: Diagnosis not present

## 2016-03-25 DIAGNOSIS — M9901 Segmental and somatic dysfunction of cervical region: Secondary | ICD-10-CM | POA: Diagnosis not present

## 2016-03-25 DIAGNOSIS — M542 Cervicalgia: Secondary | ICD-10-CM | POA: Diagnosis not present

## 2016-03-25 DIAGNOSIS — M5413 Radiculopathy, cervicothoracic region: Secondary | ICD-10-CM | POA: Diagnosis not present

## 2016-03-26 ENCOUNTER — Ambulatory Visit: Payer: Medicare Other | Admitting: Physical Therapy

## 2016-03-26 ENCOUNTER — Encounter: Payer: Self-pay | Admitting: Physical Therapy

## 2016-03-26 DIAGNOSIS — M6281 Muscle weakness (generalized): Secondary | ICD-10-CM | POA: Diagnosis not present

## 2016-03-26 DIAGNOSIS — M25512 Pain in left shoulder: Secondary | ICD-10-CM

## 2016-03-26 DIAGNOSIS — M25612 Stiffness of left shoulder, not elsewhere classified: Secondary | ICD-10-CM | POA: Diagnosis not present

## 2016-03-26 NOTE — Therapy (Signed)
[facility] [address], [place], [place], [address] Phone: [phone]   Fax:  [phone]  Physical Therapy Treatment  Patient Details  Name: [name] Ruiz MRN: [id number] Date of Birth: Nov 15, 1945 Referring Provider: Netta Cedars, MD  Encounter Date: 03/26/2016      PT End of Session - 03/26/16 1153    Visit Number 3   Number of Visits 10   Date for PT Re-Evaluation 05/06/16   PT Start Time 1100   PT Stop Time 1145   PT Time Calculation (min) 45 min   Activity Tolerance Patient tolerated treatment well   Behavior During Therapy [facility] for tasks assessed/performed      Past Medical History:  Diagnosis Date  . Arthritis   . Constipation due to opioid therapy   . Hypothyroidism   . Scoliosis     Past Surgical History:  Procedure Laterality Date  . Jaw wired     12/09/15- 25 years ago  . ORIF SHOULDER FRACTURE Left 12/10/2015   Procedure: OPEN REDUCTION INTERNAL FIXATION (ORIF) LEFT  SHOULDER FRACTURE;  Surgeon: Netta Cedars, MD;  Location: [place];  Service: Orthopedics;  Laterality: Left;  . Orthodontia     with jaw fracture    There were no vitals filed for this visit.      Subjective Assessment - 03/26/16 1110    Subjective I have been doing better. Pt did report waking up in the middle of the night with tingling sensation in left arm and fingers. Pt reported that changing positions seemed to help. pt reporting 2/10 pain.    Limitations House hold activities;Lifting   Patient Stated Goals reaching seat belt, get back to doing yoga   Currently in Pain? Yes   Pain Score 2    Pain Location Shoulder   Pain Orientation Left   Pain Descriptors / Indicators Aching   Pain Type Surgical pain   Pain Onset More than a month ago   Pain Frequency Intermittent   Aggravating Factors  moving it   Pain Relieving Factors over the counter pain meds and ointment   Effect of Pain on Daily Activities carrying  groceries, reaching for my seatbelt, yoga   Multiple Pain Sites No            [facility] PT Assessment - 03/26/16 0001      PROM   PROM Assessment Site Shoulder   Right/Left Shoulder Left   Left Shoulder Flexion 130 Degrees   Left Shoulder ABduction 65 Degrees                     [facility] Adult PT Treatment/Exercise - 03/26/16 0001      Shoulder Exercises: Supine   Other Supine Exercises flexion with cane holding 20 seconds 5 reps     Shoulder Exercises: Pulleys   Flexion 3 minutes   Flexion Limitations working on slow motion     Shoulder Exercises: Isometric Strengthening   Flexion 5X10"   Extension 5X10"   External Rotation 5X10"   Internal Rotation 5X10"     Shoulder Exercises: Stretch   Table Stretch - Abduction 5 reps;10 seconds     Manual Therapy   Manual Therapy Passive ROM                PT Education - 03/26/16 1152    Education provided Yes   Education Details HEP   Person(s) Educated Patient   Methods Explanation;Demonstration;Handout;Tactile cues   Comprehension Verbalized  understanding;Returned demonstration          PT Short Term Goals - 03/26/16 1204      PT SHORT TERM GOAL #1   Title independent with initial HEP   Time 4   Period Weeks   Status On-going     PT SHORT TERM GOAL #2   Title Lt shoulder AROM flexion 120 degrees for improved overhead reaching   Baseline 130 degrees standing   Time 4   Period Weeks   Status Achieved     PT SHORT TERM GOAL #3   Title Lt shoulder external rotation 60 degrees for improved reaching back to put seat belt on   Period Weeks   Status On-going           PT Long Term Goals - 03/11/16 2152      PT LONG TERM GOAL #1   Title  FOTO < or = to 33% limitation   Baseline 52% limitation   Time 8   Period Weeks   Status New     PT LONG TERM GOAL #2   Title Able to reach back to get seat belt without pain   Time 8   Period Weeks   Status New     PT LONG TERM GOAL #3   Title  Able to return to yoga   Time 8   Period Weeks   Status New     PT LONG TERM GOAL #4   Title Lt shoulder flexion 145 degrees without increased pain for improved reaching overhead   Time 8   Period Weeks   Status New     PT LONG TERM GOAL #5   Title Pt MMT 4/5 Lt shoulder abduction for improved shoulder stability for safe functional reaching overhead.   Time 8   Period Weeks   Status New               Plan - 03/26/16 1158    Clinical Impression Statement Patient is proressing with her shoulder ROM reporting pain levels of 2/10. Pt reporting a good doctor report with no limitations. We added gentle table slides for increasing abduction. Continue skilled PT to progress ROM and strengthening in order to improve functional mobility.    Rehab Potential Excellent   Clinical Impairments Affecting Rehab Potential be careful with abduction   PT Frequency 1x / week   PT Duration 8 weeks   PT Treatment/Interventions Moist Heat;Cryotherapy;Therapeutic exercise;Therapeutic activities;Manual techniques;Patient/family education;Passive range of motion;Electrical Stimulation;Taping;Vasopneumatic Device;Dry needling;Functional mobility training;Neuromuscular re-education;Scar mobilization   PT Next Visit Plan ROM and strengthening to tolerance, add to HEP for shoulder abduction   PT Home Exercise Plan shoulder abduction with no weight   Consulted and Agree with Plan of Care Patient      Patient will benefit from skilled therapeutic intervention in order to improve the following deficits and impairments:  Decreased endurance, Decreased mobility, Decreased range of motion, Decreased strength, Postural dysfunction, Hypomobility, Increased muscle spasms, Pain, Impaired UE functional use  Visit Diagnosis: Acute pain of left shoulder  Stiffness of left shoulder, not elsewhere classified  Muscle weakness (generalized)     Problem List Patient Active Problem List   Diagnosis Date Noted  .  Proximal humerus fracture 12/10/2015  . Right foot pain 09/26/2010  . Pes planus 09/26/2010  . Metatarsalgia 09/26/2010    Oretha Caprice, MPT  03/26/2016, 12:07 PM  [facility] [address] [place], [place], [address] Phone: [phone]  Fax:  [phone]  Name: [name] Ruiz MRN: [id number] Date of Birth: 12-May-1945

## 2016-03-26 NOTE — Patient Instructions (Signed)
   Shoulder Isometrics  Against a door or a wall, roll a towel and place in armpit. You will push in all four directions. Shown for the right hand, stand with hand pressing towards body. Turn around and push away from body. Standing towards wall, push fist into wall. Standing with back against wall, press elbow into wall.    Perform each exercise  10 times holding 5-10 seconds each.  2 times each day.    PROM Shoulder Abduction on Table  Rest your affected shoulder's hand on a counter or table as pictured to the side of your body. Lean toward the table/counter allowing your hand to slide across increasing the range of motion at your shoulder going out to the side. Repeat. Perform this exercise in a relatively pain-free range of motion.    5 times holding 10 seconds each  2 times a day

## 2016-03-31 ENCOUNTER — Ambulatory Visit: Payer: Medicare Other | Admitting: Physical Therapy

## 2016-03-31 ENCOUNTER — Encounter: Payer: Self-pay | Admitting: Physical Therapy

## 2016-03-31 DIAGNOSIS — M25612 Stiffness of left shoulder, not elsewhere classified: Secondary | ICD-10-CM

## 2016-03-31 DIAGNOSIS — M6281 Muscle weakness (generalized): Secondary | ICD-10-CM | POA: Diagnosis not present

## 2016-03-31 DIAGNOSIS — M25512 Pain in left shoulder: Secondary | ICD-10-CM | POA: Diagnosis not present

## 2016-03-31 NOTE — Therapy (Signed)
[facility] [address], [place], [place], [address] Phone: [phone]   Fax:  [phone]  Physical Therapy Treatment  Patient Details  Name: [name] Ruiz MRN: [id number] Date of Birth: March 05, 1946 Referring Provider: Netta Cedars, MD  Encounter Date: 03/31/2016      PT End of Session - 03/31/16 1441    Visit Number 4   Number of Visits 10   Date for PT Re-Evaluation 05/06/16   PT Start Time [id number]   PT Stop Time 1525   PT Time Calculation (min) 49 min   Activity Tolerance Patient tolerated treatment well   Behavior During Therapy [facility] for tasks assessed/performed      Past Medical History:  Diagnosis Date  . Arthritis   . Constipation due to opioid therapy   . Hypothyroidism   . Scoliosis     Past Surgical History:  Procedure Laterality Date  . Jaw wired     12/09/15- 25 years ago  . ORIF SHOULDER FRACTURE Left 12/10/2015   Procedure: OPEN REDUCTION INTERNAL FIXATION (ORIF) LEFT  SHOULDER FRACTURE;  Surgeon: Netta Cedars, MD;  Location: [place];  Service: Orthopedics;  Laterality: Left;  . Orthodontia     with jaw fracture    There were no vitals filed for this visit.      Subjective Assessment - 03/31/16 1439    Subjective Pt reports feeling a little achy all over today possibly due to weather.    Limitations House hold activities;Lifting   Patient Stated Goals reaching seat belt, get back to doing yoga   Currently in Pain? Yes   Pain Score 2    Pain Location Shoulder   Pain Orientation Left   Pain Descriptors / Indicators Aching   Pain Type Surgical pain   Pain Onset More than a month ago   Pain Frequency Intermittent                         [facility] Adult PT Treatment/Exercise - 03/31/16 0001      Shoulder Exercises: Pulleys   Flexion 3 minutes     Shoulder Exercises: ROM/Strengthening   Ball on Wall circles two ways - 20x each   Other ROM/Strengthening Exercises  Fingger ladder x5   Other ROM/Strengthening Exercises towel stretch internal rotation - 20s x 3     Shoulder Exercises: Isometric Strengthening   Flexion 5X10"   Extension 5X10"   External Rotation 5X10"   Internal Rotation 5X10"   ABduction 5X10"   ADduction 5X10"     Shoulder Exercises: Stretch   Table Stretch - Abduction 5 reps;10 seconds     Manual Therapy   Manual Therapy Passive ROM                  PT Short Term Goals - 03/31/16 1444      PT SHORT TERM GOAL #1   Title independent with initial HEP   Time 4   Period Weeks   Status On-going     PT SHORT TERM GOAL #2   Title Lt shoulder AROM flexion 120 degrees for improved overhead reaching   Baseline 130 degrees standing   Time 4   Period Weeks   Status Achieved     PT SHORT TERM GOAL #3   Title Lt shoulder external rotation 60 degrees for improved reaching back to put seat belt on   Time 4   Period Weeks   Status On-going  PT Long Term Goals - 03/31/16 1445      PT LONG TERM GOAL #1   Title  FOTO < or = to 33% limitation   Baseline 52% limitation   Time 8   Period Weeks   Status On-going     PT LONG TERM GOAL #2   Title Able to reach back to get seat belt without pain   Time 8   Period Weeks   Status On-going     PT LONG TERM GOAL #3   Title Able to return to yoga   Time 8   Period Weeks   Status On-going     PT LONG TERM GOAL #4   Title Lt shoulder flexion 145 degrees without increased pain for improved reaching overhead   Time 8   Period Weeks   Status On-going     PT LONG TERM GOAL #5   Title Pt MMT 4/5 Lt shoulder abduction for improved shoulder stability for safe functional reaching overhead.   Time 8   Period Weeks   Status On-going               Plan - 03/31/16 1538    Clinical Impression Statement Pt continues to have limited abduction. Able to tolerate all strengthening well. Abduciont and external rotation limited by pain with manaual ROM. Pt  will continue to benefit from skilled therapy for shoulder strength, stability and ROM .    Rehab Potential Excellent   Clinical Impairments Affecting Rehab Potential be careful with abduction   PT Frequency 1x / week   PT Duration 8 weeks   PT Treatment/Interventions Moist Heat;Cryotherapy;Therapeutic exercise;Therapeutic activities;Manual techniques;Patient/family education;Passive range of motion;Electrical Stimulation;Taping;Vasopneumatic Device;Dry needling;Functional mobility training;Neuromuscular re-education;Scar mobilization   PT Next Visit Plan ROM and strengthening to tolerance, scar mobilization   Consulted and Agree with Plan of Care Patient      Patient will benefit from skilled therapeutic intervention in order to improve the following deficits and impairments:  Decreased endurance, Decreased mobility, Decreased range of motion, Decreased strength, Postural dysfunction, Hypomobility, Increased muscle spasms, Pain, Impaired UE functional use  Visit Diagnosis: Acute pain of left shoulder  Stiffness of left shoulder, not elsewhere classified  Muscle weakness (generalized)     Problem List Patient Active Problem List   Diagnosis Date Noted  . Proximal humerus fracture 12/10/2015  . Right foot pain 09/26/2010  . Pes planus 09/26/2010  . Metatarsalgia 09/26/2010    [name] Ruiz PTA 03/31/2016, 3:43 PM  [facility] [address] [place], [place], [address] Phone: [phone]   Fax:  [phone]  Name: [name] Ruiz MRN: [id number] Date of Birth: 1946-04-02

## 2016-04-01 DIAGNOSIS — M542 Cervicalgia: Secondary | ICD-10-CM | POA: Diagnosis not present

## 2016-04-01 DIAGNOSIS — M5413 Radiculopathy, cervicothoracic region: Secondary | ICD-10-CM | POA: Diagnosis not present

## 2016-04-01 DIAGNOSIS — M9901 Segmental and somatic dysfunction of cervical region: Secondary | ICD-10-CM | POA: Diagnosis not present

## 2016-04-01 DIAGNOSIS — M5030 Other cervical disc degeneration, unspecified cervical region: Secondary | ICD-10-CM | POA: Diagnosis not present

## 2016-04-07 DIAGNOSIS — M5413 Radiculopathy, cervicothoracic region: Secondary | ICD-10-CM | POA: Diagnosis not present

## 2016-04-07 DIAGNOSIS — M9901 Segmental and somatic dysfunction of cervical region: Secondary | ICD-10-CM | POA: Diagnosis not present

## 2016-04-07 DIAGNOSIS — M542 Cervicalgia: Secondary | ICD-10-CM | POA: Diagnosis not present

## 2016-04-07 DIAGNOSIS — M5030 Other cervical disc degeneration, unspecified cervical region: Secondary | ICD-10-CM | POA: Diagnosis not present

## 2016-04-08 ENCOUNTER — Encounter: Payer: Self-pay | Admitting: Physical Therapy

## 2016-04-08 ENCOUNTER — Ambulatory Visit: Payer: Medicare Other | Admitting: Physical Therapy

## 2016-04-08 DIAGNOSIS — M5413 Radiculopathy, cervicothoracic region: Secondary | ICD-10-CM | POA: Diagnosis not present

## 2016-04-08 DIAGNOSIS — M25512 Pain in left shoulder: Secondary | ICD-10-CM | POA: Diagnosis not present

## 2016-04-08 DIAGNOSIS — M25612 Stiffness of left shoulder, not elsewhere classified: Secondary | ICD-10-CM

## 2016-04-08 DIAGNOSIS — M6281 Muscle weakness (generalized): Secondary | ICD-10-CM

## 2016-04-08 DIAGNOSIS — M5030 Other cervical disc degeneration, unspecified cervical region: Secondary | ICD-10-CM | POA: Diagnosis not present

## 2016-04-08 DIAGNOSIS — M542 Cervicalgia: Secondary | ICD-10-CM | POA: Diagnosis not present

## 2016-04-08 DIAGNOSIS — M9901 Segmental and somatic dysfunction of cervical region: Secondary | ICD-10-CM | POA: Diagnosis not present

## 2016-04-08 NOTE — Therapy (Signed)
[facility] [address], [place], [place], [address] Phone: [phone]   Fax:  [phone]  Physical Therapy Treatment  Patient Details  Name: [name] Ruiz MRN: [id number] Date of Birth: Dec 17, 1945 Referring Provider: Netta Cedars, MD  Encounter Date: 04/08/2016      PT End of Session - 04/08/16 1524    Visit Number 5   Number of Visits 10   Date for PT Re-Evaluation 05/06/16   PT Start Time [id number]   PT Stop Time 1525   PT Time Calculation (min) 40 min   Activity Tolerance Patient tolerated treatment well   Behavior During Therapy [facility] for tasks assessed/performed      Past Medical History:  Diagnosis Date  . Arthritis   . Constipation due to opioid therapy   . Hypothyroidism   . Scoliosis     Past Surgical History:  Procedure Laterality Date  . Jaw wired     12/09/15- 25 years ago  . ORIF SHOULDER FRACTURE Left 12/10/2015   Procedure: OPEN REDUCTION INTERNAL FIXATION (ORIF) LEFT  SHOULDER FRACTURE;  Surgeon: Netta Cedars, MD;  Location: [place];  Service: Orthopedics;  Laterality: Left;  . Orthodontia     with jaw fracture    There were no vitals filed for this visit.      Subjective Assessment - 04/08/16 1450    Subjective Cold weather increases my discomfort. My shoulder is tight and is achy. Hurts most to get the seatbelt. I am able to use your left arm at times.    Limitations House hold activities;Lifting   Patient Stated Goals reaching seat belt, get back to doing yoga   Currently in Pain? Yes   Pain Score 2    Pain Location Shoulder   Pain Orientation Left   Pain Descriptors / Indicators Aching   Pain Type Surgical pain   Pain Onset More Ruiz a month ago   Pain Frequency Intermittent   Aggravating Factors  moving it   Pain Relieving Factors over the counter pain  meds and ointment.    Effect of Pain on Daily Activities Carrying groceries, reaching for my seatbelt, yoga   Multiple Pain  Sites No            [facility] PT Assessment - 04/08/16 0001      Precautions   Precautions Shoulder   Type of Shoulder Precautions move where there is pain and no heavy weights                     [facility] Adult PT Treatment/Exercise - 04/08/16 0001      Shoulder Exercises: Supine   External Rotation AAROM;Strengthening;Left;20 reps  to 45 degrees abduction   Flexion AAROM;Strengthening;Left;20 reps   ABduction AAROM;Strengthening;Left;20 reps  not past 80 degrees abduction     Shoulder Exercises: Seated   Protraction Left;Strengthening;20 reps     Shoulder Exercises: Standing   Other Standing Exercises push ball into mat at hip height pushing 5 sec 10x2, foward/backward 2x10, and side to side 2x10     Shoulder Exercises: Pulleys   Flexion 3 minutes     Shoulder Exercises: ROM/Strengthening   Other ROM/Strengthening Exercises UE ranger for shoulder flexion  height #3                PT Education - 04/08/16 1524    Education provided No          PT Short Term Goals - 04/08/16 1527  PT SHORT TERM GOAL #1   Title independent with initial HEP   Time 4   Period Weeks   Status Achieved     PT SHORT TERM GOAL #2   Title Lt shoulder AROM flexion 120 degrees for improved overhead reaching   Baseline 130 degrees standing   Time 4   Status Achieved     PT SHORT TERM GOAL #3   Title Lt shoulder external rotation 60 degrees for improved reaching back to put seat belt on   Time 4   Period Weeks   Status On-going           PT Long Term Goals - 03/31/16 1445      PT LONG TERM GOAL #1   Title  FOTO < or = to 33% limitation   Baseline 52% limitation   Time 8   Period Weeks   Status On-going     PT LONG TERM GOAL #2   Title Able to reach back to get seat belt without pain   Time 8   Period Weeks   Status On-going     PT LONG TERM GOAL #3   Title Able to return to yoga   Time 8   Period Weeks   Status On-going     PT LONG TERM  GOAL #4   Title Lt shoulder flexion 145 degrees without increased pain for improved reaching overhead   Time 8   Period Weeks   Status On-going     PT LONG TERM GOAL #5   Title Pt MMT 4/5 Lt shoulder abduction for improved shoulder stability for safe functional reaching overhead.   Time 8   Period Weeks   Status On-going               Plan - 04/08/16 1524    Clinical Impression Statement Patient is able to use her left arm more to put seatbelt on. Patient has less pain.  Patient reports daily life activities are easier.  Patient able to do yoga now. Patient will benefit from skilled therapy to for shoulder strength, stability and ROM.    Rehab Potential Excellent   Clinical Impairments Affecting Rehab Potential be careful with abduction   PT Frequency 1x / week   PT Duration 8 weeks   PT Treatment/Interventions Moist Heat;Cryotherapy;Therapeutic exercise;Therapeutic activities;Manual techniques;Patient/family education;Passive range of motion;Electrical Stimulation;Taping;Vasopneumatic Device;Dry needling;Functional mobility training;Neuromuscular re-education;Scar mobilization   PT Next Visit Plan ROM and strengthening to tolerance, scar mobilization; measure shoulder ROM   PT Home Exercise Plan shoulder abduction with no weight   Consulted and Agree with Plan of Care Patient      Patient will benefit from skilled therapeutic intervention in order to improve the following deficits and impairments:  Decreased endurance, Decreased mobility, Decreased range of motion, Decreased strength, Postural dysfunction, Hypomobility, Increased muscle spasms, Pain, Impaired UE functional use  Visit Diagnosis: Acute pain of left shoulder  Stiffness of left shoulder, not elsewhere classified  Muscle weakness (generalized)     Problem List Patient Active Problem List   Diagnosis Date Noted  . Proximal humerus fracture 12/10/2015  . Right foot pain 09/26/2010  . Pes planus 09/26/2010   . Metatarsalgia 09/26/2010    Earlie Counts, PT 04/08/16 3:28 PM   [facility] [address] [place], [place], [address] Phone: [phone]   Fax:  [phone]  Name: [name] Ruiz MRN: [id number] Date of Birth: 1946-01-03

## 2016-04-15 ENCOUNTER — Encounter: Payer: Self-pay | Admitting: Physical Therapy

## 2016-04-15 ENCOUNTER — Ambulatory Visit: Payer: Medicare Other | Attending: Orthopedic Surgery | Admitting: Physical Therapy

## 2016-04-15 DIAGNOSIS — M25512 Pain in left shoulder: Secondary | ICD-10-CM | POA: Insufficient documentation

## 2016-04-15 DIAGNOSIS — M6281 Muscle weakness (generalized): Secondary | ICD-10-CM | POA: Insufficient documentation

## 2016-04-15 DIAGNOSIS — M542 Cervicalgia: Secondary | ICD-10-CM | POA: Diagnosis not present

## 2016-04-15 DIAGNOSIS — M9901 Segmental and somatic dysfunction of cervical region: Secondary | ICD-10-CM | POA: Diagnosis not present

## 2016-04-15 DIAGNOSIS — M25612 Stiffness of left shoulder, not elsewhere classified: Secondary | ICD-10-CM | POA: Insufficient documentation

## 2016-04-15 DIAGNOSIS — M5413 Radiculopathy, cervicothoracic region: Secondary | ICD-10-CM | POA: Diagnosis not present

## 2016-04-15 DIAGNOSIS — M5030 Other cervical disc degeneration, unspecified cervical region: Secondary | ICD-10-CM | POA: Diagnosis not present

## 2016-04-15 NOTE — Therapy (Signed)
[facility] [address] [place], [place], [address] Phone: [phone]   Fax:  [phone]  Physical Therapy Treatment  Patient Details  Name: [name] Ruiz MRN: [id number] Date of Birth: 1945/07/14 Referring Provider: Netta Cedars, MD  Encounter Date: 04/15/2016      PT End of Session - 04/15/16 1445    Visit Number 6   Number of Visits 10   Date for PT Re-Evaluation 05/06/16   PT Start Time 1400   PT Stop Time 1442   PT Time Calculation (min) 42 min   Activity Tolerance Patient tolerated treatment well   Behavior During Therapy [facility] for tasks assessed/performed      Past Medical History:  Diagnosis Date  . Arthritis   . Constipation due to opioid therapy   . Hypothyroidism   . Scoliosis     Past Surgical History:  Procedure Laterality Date  . Jaw wired     12/09/15- 25 years ago  . ORIF SHOULDER FRACTURE Left 12/10/2015   Procedure: OPEN REDUCTION INTERNAL FIXATION (ORIF) LEFT  SHOULDER FRACTURE;  Surgeon: Netta Cedars, MD;  Location: [place];  Service: Orthopedics;  Laterality: Left;  . Orthodontia     with jaw fracture    There were no vitals filed for this visit.      Subjective Assessment - 04/15/16 1404    Subjective Shoulder feels okay.  I am tight today.    Limitations House hold activities;Lifting   Patient Stated Goals reaching seat belt, get back to doing yoga   Currently in Pain? Yes   Pain Score 2    Pain Location Shoulder   Pain Orientation Left   Pain Descriptors / Indicators Aching   Pain Type Surgical pain   Pain Onset More than a month ago   Pain Frequency Intermittent   Aggravating Factors  moving it   Pain Relieving Factors ointment, over the counter pain medications   Effect of Pain on Daily Activities carrying groceries, reaching for my seat belt, yoga   Multiple Pain Sites No            [facility] PT Assessment - 04/15/16 0001      AROM   Overall AROM  Other  (comment)  left shoulder abduction AROM 80 degrees   Left Shoulder Extension 40 Degrees   Left Shoulder Flexion 130 Degrees   Left Shoulder Internal Rotation 62 Degrees  45 degrees abduction   Left Shoulder External Rotation 30 Degrees  45 degrees abduction     PROM   Left Shoulder Flexion 135 Degrees   Left Shoulder ABduction 80 Degrees                     [facility] Adult PT Treatment/Exercise - 04/15/16 0001      Shoulder Exercises: Supine   Horizontal ABduction Strengthening;Right;20 reps   External Rotation AAROM;Strengthening;Left;20 reps  to 45 degrees abduction   Flexion AAROM;Strengthening;Left;20 reps   ABduction AAROM;Strengthening;Left;20 reps  not past 80 degrees abduction   Other Supine Exercises shoulder circles at 90 degrees flexion 20x both ways     Shoulder Exercises: Standing   Flexion Strengthening;AAROM;Left;20 reps;Weights   Shoulder Flexion Weight (lbs) 1   Flexion Limitations used UE exerciser then moved arm side to side.    Extension Strengthening;Both;20 reps;Theraband   Theraband Level (Shoulder Extension) Level 1 (Yellow)  tactlile cues to retract scapula   Row 20 reps;Strengthening;Both;Theraband  tactile cues to retract scapula   Theraband  Level (Shoulder Row) Level 1 (Yellow)     Shoulder Exercises: Pulleys   Flexion 3 minutes   ABduction 2 minutes     Manual Therapy   Manual Therapy Passive ROM   Passive ROM left shoulder for all ranges                PT Education - 04/15/16 1445    Education provided No          PT Short Term Goals - 04/15/16 1408      PT SHORT TERM GOAL #3   Title Lt shoulder external rotation 60 degrees for improved reaching back to put seat belt on   Time 4   Period Weeks   Status --           PT Long Term Goals - 03/31/16 1445      PT LONG TERM GOAL #1   Title  FOTO < or = to 33% limitation   Baseline 52% limitation   Time 8   Period Weeks   Status On-going     PT LONG TERM  GOAL #2   Title Able to reach back to get seat belt without pain   Time 8   Period Weeks   Status On-going     PT LONG TERM GOAL #3   Title Able to return to yoga   Time 8   Period Weeks   Status On-going     PT LONG TERM GOAL #4   Title Lt shoulder flexion 145 degrees without increased pain for improved reaching overhead   Time 8   Period Weeks   Status On-going     PT LONG TERM GOAL #5   Title Pt MMT 4/5 Lt shoulder abduction for improved shoulder stability for safe functional reaching overhead.   Time 8   Period Weeks   Status On-going               Plan - 04/15/16 1445    Clinical Impression Statement Patient has increased left shoulder ROM.  Patient has clicking in left shoulder with exercises.  Patient needed verbal cues to retract her scapula with exercises.  Patient will be having 2 more visits prior to discharge.  She is leaving for [place] in 2 weeks. Patient will benefit from skilled therapy to improve shoulder ROM and strength.    Rehab Potential Excellent   Clinical Impairments Affecting Rehab Potential be careful with abduction   PT Frequency 1x / week   PT Duration 8 weeks   PT Treatment/Interventions Moist Heat;Cryotherapy;Therapeutic exercise;Therapeutic activities;Manual techniques;Patient/family education;Passive range of motion;Electrical Stimulation;Taping;Vasopneumatic Device;Dry needling;Functional mobility training;Neuromuscular re-education;Scar mobilization   PT Next Visit Plan work on HEP due to leaving for [place] in 2 weeks.  She hsa only 2 session left. She will be in [place] till March.    PT Home Exercise Plan progress   Consulted and Agree with Plan of Care Patient      Patient will benefit from skilled therapeutic intervention in order to improve the following deficits and impairments:  Decreased endurance, Decreased mobility, Decreased range of motion, Decreased strength, Postural dysfunction, Hypomobility, Increased muscle spasms,  Pain, Impaired UE functional use  Visit Diagnosis: Acute pain of left shoulder  Stiffness of left shoulder, not elsewhere classified  Muscle weakness (generalized)     Problem List Patient Active Problem List   Diagnosis Date Noted  . Proximal humerus fracture 12/10/2015  . Right foot pain 09/26/2010  . Pes planus 09/26/2010  . Metatarsalgia  09/26/2010    Earlie Counts, PT 04/15/16 2:49 PM   [facility] [address], [place], [place], [address] Phone: [phone]   Fax:  [phone]  Name: [name] Ruiz MRN: [id number] Date of Birth: 10-Feb-1946

## 2016-04-22 ENCOUNTER — Ambulatory Visit: Payer: Medicare Other | Admitting: Physical Therapy

## 2016-04-22 ENCOUNTER — Encounter: Payer: Self-pay | Admitting: Physical Therapy

## 2016-04-22 DIAGNOSIS — M25612 Stiffness of left shoulder, not elsewhere classified: Secondary | ICD-10-CM | POA: Diagnosis not present

## 2016-04-22 DIAGNOSIS — M5030 Other cervical disc degeneration, unspecified cervical region: Secondary | ICD-10-CM | POA: Diagnosis not present

## 2016-04-22 DIAGNOSIS — M6281 Muscle weakness (generalized): Secondary | ICD-10-CM | POA: Diagnosis not present

## 2016-04-22 DIAGNOSIS — M5413 Radiculopathy, cervicothoracic region: Secondary | ICD-10-CM | POA: Diagnosis not present

## 2016-04-22 DIAGNOSIS — M542 Cervicalgia: Secondary | ICD-10-CM | POA: Diagnosis not present

## 2016-04-22 DIAGNOSIS — M25512 Pain in left shoulder: Secondary | ICD-10-CM | POA: Diagnosis not present

## 2016-04-22 DIAGNOSIS — M9901 Segmental and somatic dysfunction of cervical region: Secondary | ICD-10-CM | POA: Diagnosis not present

## 2016-04-22 NOTE — Therapy (Addendum)
[facility] [address] [place], [place], [address] Phone: [phone]   Fax:  [phone]  Physical Therapy Treatment  Patient Details  Name: DANAYAH SMYRE MRN: [id number] Date of Birth: Aug 11, 1945 Referring Provider: Netta Cedars, MD  Encounter Date: 04/22/2016      PT End of Session - 04/22/16 1403    Visit Number 7   Number of Visits 10   Date for PT Re-Evaluation 05/06/16   PT Start Time 1400   PT Stop Time 1442   PT Time Calculation (min) 42 min   Activity Tolerance Patient tolerated treatment well   Behavior During Therapy [facility] for tasks assessed/performed      Past Medical History:  Diagnosis Date  . Arthritis   . Constipation due to opioid therapy   . Hypothyroidism   . Scoliosis     Past Surgical History:  Procedure Laterality Date  . Jaw wired     12/09/15- 25 years ago  . ORIF SHOULDER FRACTURE Left 12/10/2015   Procedure: OPEN REDUCTION INTERNAL FIXATION (ORIF) LEFT  SHOULDER FRACTURE;  Surgeon: Netta Cedars, MD;  Location: [place];  Service: Orthopedics;  Laterality: Left;  . Orthodontia     with jaw fracture    There were no vitals filed for this visit.     g-code: Functional assessment tool used is FOTO and clinical reasoning improved due to improved functional mobility; Functional limitation is carrying, moving and handling objects; goal status is CJ; Discharge status is CJ.  Earlie Counts, PT 05/27/16 9:01 AM                    [facility] Adult PT Treatment/Exercise - 04/22/16 0001      Shoulder Exercises: Supine   Horizontal ABduction Strengthening;20 reps;Both   External Rotation Strengthening;Left   Flexion Strengthening;Right;10 reps   ABduction Strengthening;Left;20 reps   Other Supine Exercises shoulder circles at 90 degrees flexion 20x both ways   Other Supine Exercises Flexion with cane     Shoulder Exercises: Standing   External Rotation Strengthening;20  reps;Left   Theraband Level (Shoulder External Rotation) Level 1 (Yellow)   Extension Strengthening;Both;20 reps;Theraband   Theraband Level (Shoulder Extension) Level 1 (Yellow)  tactlile cues to retract scapula   Row 20 reps;Strengthening;Both;Theraband  tactile cues to retract scapula   Theraband Level (Shoulder Row) Level 1 (Yellow)     Shoulder Exercises: Pulleys   Flexion 3 minutes   ABduction 2 minutes     Shoulder Exercises: Isometric Strengthening   Flexion --  Manual isometric strengthening in all planes                PT Education - 04/22/16 1440    Education provided Yes   Education Details Shoulder strengthening   Person(s) Educated Patient   Methods Explanation;Demonstration;Handout   Comprehension Verbalized understanding          PT Short Term Goals - 04/15/16 1408      PT SHORT TERM GOAL #3   Title Lt shoulder external rotation 60 degrees for improved reaching back to put seat belt on   Time 4   Period Weeks   Status --           PT Long Term Goals - 04/22/16 1404      PT LONG TERM GOAL #1   Title  FOTO < or = to 33% limitation   Baseline 52% limitation   Time 8   Period Weeks  Status On-going     PT LONG TERM GOAL #2   Title Able to reach back to get seat belt without pain   Time 8   Period Weeks   Status On-going     PT LONG TERM GOAL #3   Title Able to return to yoga   Time 8   Period Weeks   Status On-going     PT LONG TERM GOAL #4   Title Lt shoulder flexion 145 degrees without increased pain for improved reaching overhead   Time 8   Period Weeks   Status On-going     PT LONG TERM GOAL #5   Title Pt MMT 4/5 Lt shoulder abduction for improved shoulder stability for safe functional reaching overhead.   Time 8   Period Weeks   Status On-going               Plan - 04/22/16 1545    Clinical Impression Statement Pt continues to progress with shoulder ROM. Lacking scapular stability evident with grinding and  clicking with certain shoulder movements. Able to tolerate all exercises well. Pt given home exercises and plans to do them while she is away from therapy.    Rehab Potential Excellent   Clinical Impairments Affecting Rehab Potential be careful with abduction   PT Frequency 1x / week   PT Duration 8 weeks   PT Treatment/Interventions Moist Heat;Cryotherapy;Therapeutic exercise;Therapeutic activities;Manual techniques;Patient/family education;Passive range of motion;Electrical Stimulation;Taping;Vasopneumatic Device;Dry needling;Functional mobility training;Neuromuscular re-education;Scar mobilization   PT Next Visit Plan Review home exercises, continue shoulder stabilization   Consulted and Agree with Plan of Care Patient      Patient will benefit from skilled therapeutic intervention in order to improve the following deficits and impairments:  Decreased endurance, Decreased mobility, Decreased range of motion, Decreased strength, Postural dysfunction, Hypomobility, Increased muscle spasms, Pain, Impaired UE functional use  Visit Diagnosis: Acute pain of left shoulder  Stiffness of left shoulder, not elsewhere classified  Muscle weakness (generalized)     Problem List Patient Active Problem List   Diagnosis Date Noted  . Proximal humerus fracture 12/10/2015  . Right foot pain 09/26/2010  . Pes planus 09/26/2010  . Metatarsalgia 09/26/2010    Mikle Bosworth PTA 04/22/2016, 3:52 PM  [facility] [address] [place], [place], [address] Phone: [phone]   Fax:  [phone]  Name: RIHAM POLYAKOV MRN: [id number] Date of Birth: 08-Sep-1945  PHYSICAL THERAPY DISCHARGE SUMMARY  Visits from Start of Care: 7  Current functional level related to goals / functional outcomes: See above. Patient is in [place] for several months therefore she is being discharged. Patient has not met her LTG's but will be working on  her HEP while in [place].    Remaining deficits: See above.    Education / Equipment: HEP Plan: Patient agrees to discharge.  Patient goals were not met. Patient is being discharged due to being pleased with the current functional level.  Patient is in [place] for several months. Thank you for the referral. Earlie Counts, PT 05/27/16 9:04 AM  ?????

## 2016-04-22 NOTE — Patient Instructions (Signed)
Resistive Band Rowing    With resistive band anchored in door, grasp both ends. Keeping elbows bent, pull back, squeezing shoulder blades together.  Repeat _10___ times. Do __2__ sets.  http://gt2.exer.us/98   Copyright  VHI. All rights reserved (Home) Extension / Flexion (Assist)    Face anchor, right arm as far forward and up as is pain free. Pull arm down toward side. Repeat __10__ times per set. Do __2__ sets per session.  Copyright  VHI. All rights reserved.  (Home) External Rotation: Resting Position    Opposite side toward anchor, squeeze roller to right side, elbow in, bent to 90, forearm across body. Rotate shoulder by pulling hand away from body. Keep elbow bent to 90, holding roller. Repeat _10___ times per set. Do _2_ sets per session.  Copyright  VHI. All rights reserved.   Mikle Bosworth, PTA 04/22/16 2:13 PM  [facility] [address] [place], [place] [address] Phone # [phone] Fax [phone]

## 2016-04-27 DIAGNOSIS — E063 Autoimmune thyroiditis: Secondary | ICD-10-CM | POA: Diagnosis not present

## 2016-04-27 DIAGNOSIS — Z136 Encounter for screening for cardiovascular disorders: Secondary | ICD-10-CM | POA: Diagnosis not present

## 2016-04-27 DIAGNOSIS — E559 Vitamin D deficiency, unspecified: Secondary | ICD-10-CM | POA: Diagnosis not present

## 2016-04-27 DIAGNOSIS — R7989 Other specified abnormal findings of blood chemistry: Secondary | ICD-10-CM | POA: Diagnosis not present

## 2016-04-29 ENCOUNTER — Ambulatory Visit: Payer: Self-pay | Admitting: Physical Therapy

## 2016-07-15 DIAGNOSIS — M542 Cervicalgia: Secondary | ICD-10-CM | POA: Diagnosis not present

## 2016-07-15 DIAGNOSIS — M9901 Segmental and somatic dysfunction of cervical region: Secondary | ICD-10-CM | POA: Diagnosis not present

## 2016-07-15 DIAGNOSIS — M5030 Other cervical disc degeneration, unspecified cervical region: Secondary | ICD-10-CM | POA: Diagnosis not present

## 2016-07-15 DIAGNOSIS — M5413 Radiculopathy, cervicothoracic region: Secondary | ICD-10-CM | POA: Diagnosis not present

## 2016-07-22 DIAGNOSIS — M9901 Segmental and somatic dysfunction of cervical region: Secondary | ICD-10-CM | POA: Diagnosis not present

## 2016-07-22 DIAGNOSIS — M542 Cervicalgia: Secondary | ICD-10-CM | POA: Diagnosis not present

## 2016-07-22 DIAGNOSIS — M5030 Other cervical disc degeneration, unspecified cervical region: Secondary | ICD-10-CM | POA: Diagnosis not present

## 2016-07-22 DIAGNOSIS — M5413 Radiculopathy, cervicothoracic region: Secondary | ICD-10-CM | POA: Diagnosis not present

## 2016-08-03 DIAGNOSIS — M9901 Segmental and somatic dysfunction of cervical region: Secondary | ICD-10-CM | POA: Diagnosis not present

## 2016-08-03 DIAGNOSIS — M5030 Other cervical disc degeneration, unspecified cervical region: Secondary | ICD-10-CM | POA: Diagnosis not present

## 2016-08-03 DIAGNOSIS — M5413 Radiculopathy, cervicothoracic region: Secondary | ICD-10-CM | POA: Diagnosis not present

## 2016-08-03 DIAGNOSIS — M542 Cervicalgia: Secondary | ICD-10-CM | POA: Diagnosis not present

## 2016-08-09 DIAGNOSIS — D72819 Decreased white blood cell count, unspecified: Secondary | ICD-10-CM | POA: Diagnosis not present

## 2016-08-12 DIAGNOSIS — M5030 Other cervical disc degeneration, unspecified cervical region: Secondary | ICD-10-CM | POA: Diagnosis not present

## 2016-08-12 DIAGNOSIS — M542 Cervicalgia: Secondary | ICD-10-CM | POA: Diagnosis not present

## 2016-08-12 DIAGNOSIS — M5413 Radiculopathy, cervicothoracic region: Secondary | ICD-10-CM | POA: Diagnosis not present

## 2016-08-12 DIAGNOSIS — M9901 Segmental and somatic dysfunction of cervical region: Secondary | ICD-10-CM | POA: Diagnosis not present

## 2016-08-16 DIAGNOSIS — M8000XA Age-related osteoporosis with current pathological fracture, unspecified site, initial encounter for fracture: Secondary | ICD-10-CM | POA: Diagnosis not present

## 2016-08-16 DIAGNOSIS — Z0000 Encounter for general adult medical examination without abnormal findings: Secondary | ICD-10-CM | POA: Diagnosis not present

## 2016-08-16 DIAGNOSIS — Z79899 Other long term (current) drug therapy: Secondary | ICD-10-CM | POA: Diagnosis not present

## 2016-08-16 DIAGNOSIS — E039 Hypothyroidism, unspecified: Secondary | ICD-10-CM | POA: Diagnosis not present

## 2016-08-16 DIAGNOSIS — S92253A Displaced fracture of navicular [scaphoid] of unspecified foot, initial encounter for closed fracture: Secondary | ICD-10-CM | POA: Diagnosis not present

## 2016-08-16 DIAGNOSIS — I10 Essential (primary) hypertension: Secondary | ICD-10-CM | POA: Diagnosis not present

## 2016-08-16 DIAGNOSIS — D472 Monoclonal gammopathy: Secondary | ICD-10-CM | POA: Diagnosis not present

## 2016-08-26 DIAGNOSIS — M542 Cervicalgia: Secondary | ICD-10-CM | POA: Diagnosis not present

## 2016-08-26 DIAGNOSIS — M9901 Segmental and somatic dysfunction of cervical region: Secondary | ICD-10-CM | POA: Diagnosis not present

## 2016-08-26 DIAGNOSIS — M5413 Radiculopathy, cervicothoracic region: Secondary | ICD-10-CM | POA: Diagnosis not present

## 2016-08-26 DIAGNOSIS — M5030 Other cervical disc degeneration, unspecified cervical region: Secondary | ICD-10-CM | POA: Diagnosis not present

## 2016-08-30 ENCOUNTER — Ambulatory Visit: Payer: Medicare Other | Attending: Family Medicine | Admitting: Physical Therapy

## 2016-08-30 ENCOUNTER — Encounter: Payer: Self-pay | Admitting: Physical Therapy

## 2016-08-30 DIAGNOSIS — M6281 Muscle weakness (generalized): Secondary | ICD-10-CM | POA: Insufficient documentation

## 2016-08-30 DIAGNOSIS — M79671 Pain in right foot: Secondary | ICD-10-CM | POA: Diagnosis not present

## 2016-08-30 NOTE — Therapy (Signed)
[facility] [address] [place], [place], [address] Phone: [phone]   Fax:  [phone]  Physical Therapy Treatment  Patient Details  Name: [name] Ruiz MRN: [id number] Date of Birth: May 28, 1945 Referring Provider: Dr. Jonathon Jordan  Encounter Date: 08/30/2016      [name] Ruiz End of Session - 08/30/16 1620    Visit Number 1   Number of Visits 10   Date for [name] Ruiz Re-Evaluation 10/25/16   Authorization Type medicare g-code 10th visit; KX on 15th visit   [name] Ruiz Start Time 1445   [name] Ruiz Stop Time 1530   [name] Ruiz Time Calculation (min) 45 min   Activity Tolerance Patient tolerated treatment well   Behavior During Therapy [facility] for tasks assessed/performed      Past Medical History:  Diagnosis Date  . Arthritis   . Constipation due to opioid therapy   . Hypothyroidism   . Scoliosis     Past Surgical History:  Procedure Laterality Date  . Jaw wired     12/09/15- 25 years ago  . ORIF SHOULDER FRACTURE Left 12/10/2015   Procedure: OPEN REDUCTION INTERNAL FIXATION (ORIF) LEFT  SHOULDER FRACTURE;  Surgeon: Netta Cedars, MD;  Location: [place];  Service: Orthopedics;  Laterality: Left;  . Orthodontia     with jaw fracture    There were no vitals filed for this visit.      Subjective Assessment - 08/30/16 1448    Subjective right foot pain that is affecting my balance. I wear orthotics in my shoes.  I have scoliosis. I am off with my balance with yoga moves on one leg.    Patient Stated Goals Improve my balance   Currently in Pain? Yes   Pain Score 5    Pain Location Foot   Pain Orientation Right   Pain Descriptors / Indicators Sharp   Pain Type Chronic pain   Pain Onset More than a month ago   Pain Frequency Intermittent   Aggravating Factors  quick movement,dancing moves,    Pain Relieving Factors stretching right foot   Multiple Pain Sites No            [facility] [name] Ruiz Assessment - 08/30/16 0001      Assessment   Medical  Diagnosis M80.00XA osteoporosis with fracture; Y50.354S Navicular fracture, foot   Referring Provider Dr. Jonathon Jordan   Prior Therapy not for balance     Precautions   Precautions Other (comment)   Precaution Comments osteoporosis     Restrictions   Weight Bearing Restrictions No     Balance Screen   Has the patient fallen in the past 6 months No   Has the patient had a decrease in activity level because of a fear of falling?  No   Is the patient reluctant to leave their home because of a fear of falling?  No     Home Ec[employer] residence     Prior Function   Level of Independence Independent     Cognition   Overall Cognitive Status Within Functional Limits for tasks assessed     Observation/Other Assessments   Focus on Therapeutic Outcomes (FOTO)  40% limitation  goal is 33% limitation     Posture/Postural Control   Posture/Postural Control Postural limitations   Posture Comments scoliosis     ROM / Strength   AROM / PROM / Strength Strength;AROM     AROM   Right Ankle Dorsiflexion 12  left 15  Right Ankle Plantar Flexion 35  left 45   Right Ankle Inversion 10  left 20   Right Ankle Eversion 10  left 20     Strength   Right Ankle Dorsiflexion 5/5   Right Ankle Plantar Flexion 4/5   Right Ankle Inversion 4/5   Right Ankle Eversion 5/5     Palpation   Palpation comment no tenderness in right foot     Standardized Balance Assessment   Standardized Balance Assessment Berg Balance Test     Berg Balance Test   Sit to Stand Able to stand without using hands and stabilize independently   Standing Unsupported Able to stand safely 2 minutes   Sitting with Back Unsupported but Feet Supported on Floor or Stool Able to sit safely and securely 2 minutes   Stand to Sit Sits safely with minimal use of hands   Transfers Able to transfer safely, minor use of hands   Standing Unsupported with Eyes Closed Able to stand 10 seconds safely    Standing Ubsupported with Feet Together Able to place feet together independently and stand 1 minute safely   From Standing, Reach Forward with Outstretched Arm Can reach forward >12 cm safely (5")   From Standing Position, Pick up Object from Floor Able to pick up shoe safely and easily   From Standing Position, Turn to Look Behind Over each Shoulder Looks behind from both sides and weight shifts well   Turn 360 Degrees Able to turn 360 degrees safely in 4 seconds or less   Standing Unsupported, Alternately Place Feet on Step/Stool Able to stand independently and safely and complete 8 steps in 20 seconds   Standing Unsupported, One Foot in [place] to place foot tandem independently and hold 30 seconds   Standing on One Leg Able to lift leg independently and hold > 10 seconds   Total Score 55                             [name] Ruiz Education - 08/30/16 1619    Education provided Yes   Education Details body mechanics with gardening, lifting luggage, tips to avoid falls   Person(s) Educated Patient   Methods Explanation;Handout   Comprehension Verbalized understanding          [name] Ruiz Short Term Goals - 08/30/16 1617      [name] Ruiz SHORT TERM GOAL #1   Title independent with initial HEP   Time 4   Period Weeks   Status New     [name] Ruiz SHORT TERM GOAL #2   Title understand the do's and do not of osteoporosis to protect her back and bones   Time 4   Period Weeks   Status New     [name] Ruiz SHORT TERM GOAL #3   Title anxiety of fear of falling reduced >/= 25% with daily activities due to increased right ankle strength   Time 4   Period Weeks   Status New           [name] Ruiz Long Term Goals - 08/30/16 1529      [name] Ruiz LONG TERM GOAL #1   Title education on precautions with osteoporosis with lifting and posture   Time 8   Period Weeks   Status New     [name] Ruiz LONG TERM GOAL #2   Title understand correct posture with yoga postures to decrease strain on spine and work on balance   Time 8  Period Weeks   Status New     [name] Ruiz LONG TERM GOAL #3   Title gardening with improved balance so you do not fall over   Time 8   Period Weeks   Status New     [name] Ruiz LONG TERM GOAL #4   Title strengthen right ankle to improve fear of falling >/= 75%    Time 8   Period Weeks   Status New     [name] Ruiz LONG TERM GOAL #5   Title stepping on right foot with reduction of anxiety of falling >/= 75%   Time 8   Period Weeks   Status New               Plan - 2016/09/21 1621    Clinical Impression Statement Patient is a 71 year old female with intermittent chronic pain in right foot that affects her balance.  Patient reports her pain is  at level 5/10 with quick movements and dancing.  Patient is wearing orthotics in her shoes.  Berg balance is 55/56.  Patient has weakness and decreased AROM in right ankle compared to left.  Patient walks with rocker bottom shoes.  Patient has not fallen in 6 months. Patient has moderate scoiliosis. Patient fell 11/2015 which resulted in a shoulder fracture.  Patient reports her balance is decreased with gardening and during some single stance yoga moves.  Patient was not educated yet on ways to manage her osteoporosis and correct posture with yoga moves.  Patient is low complex evaluation due to a stable condition and comorbidities that could affect her care is osteoporosis. Patient will benefit from skilled therapy to improve right ankle strength and improve balance.    Rehab Potential Excellent   Clinical Impairments Affecting Rehab Potential osteoporosis   [name] Ruiz Frequency 1x / week   [name] Ruiz Duration 8 weeks   [name] Ruiz Treatment/Interventions Cryotherapy;Moist Heat;Therapeutic exercise;Therapeutic activities;Neuromuscular re-education;Patient/family education;Manual techniques;Taping   [name] Ruiz Next Visit Plan balance exercises, yoga posture with osteoporosis, right ankle strengthening, ways to manage osteoporosis   [name] Ruiz Home Exercise Plan progress as needed   Recommended Other  Services none   Consulted and Agree with Plan of Care Patient      Patient will benefit from skilled therapeutic intervention in order to improve the following deficits and impairments:  Pain, Decreased balance, Decreased strength, Decreased mobility  Visit Diagnosis: Muscle weakness (generalized) - Plan: [name] Ruiz plan of care cert/re-cert  Pain in right foot - Plan: [name] Ruiz plan of care cert/re-cert       G-Codes - September 21, 2016 1445    Functional Assessment Tool Used (Outpatient Only) FOTO score is 405 limitation  goal is 33% limitation   Functional Limitation Mobility: Walking and moving around   Mobility: Walking and Moving Around Current Status ([id number]) At least 40 percent but less than 60 percent impaired, limited or restricted   Mobility: Walking and Moving Around Goal Status [phone]) At least 20 percent but less than 40 percent impaired, limited or restricted      Problem List Patient Active Problem List   Diagnosis Date Noted  . Proximal humerus fracture 12/10/2015  . Right foot pain 09/26/2010  . Pes planus 09/26/2010  . Metatarsalgia 09/26/2010    [name] Ruiz, [name] Ruiz 2016/09/21 4:31 PM   [facility] [address] [place], [place], [address] Phone: [phone]   Fax:  [phone]  Name: [name] Ruiz MRN: [id number] Date of Birth: 1945/12/17

## 2016-08-30 NOTE — Patient Instructions (Addendum)
Engineer, maintenance (IT) look into [place] / Engineer, maintenance or kneel. Knee pads may be helpful. Also sit on a stool.   Copyright  VHI. All rights reserved.    Carrying Luggage    Distribute weight evenly on both sides. Use a cart whenever possible. Do not twist trunk. Move body as a unit.   Copyright  VHI. All rights reserved.  Fall Prevention and Home Safety Falls cause injuries and can affect all age groups. It is possible to use preventive measures to significantly decrease the likelihood of falls. There are many simple measures which can make your home safer and prevent falls. OUTDOORS  Repair cracks and edges of walkways and driveways.  Remove high doorway thresholds.  Trim shrubbery on the main path into your home.  Have good outside lighting.  Clear walkways of tools, rocks, debris, and clutter.  Check that handrails are not broken and are securely fastened. Both sides of steps should have handrails.  Have leaves, snow, and ice cleared regularly.  Use sand or salt on walkways during winter months.  In the garage, clean up grease or oil spills. BATHROOM  Install night lights.  Install grab bars by the toilet and in the tub and shower.  Use non-skid mats or decals in the tub or shower.  Place a plastic non-slip stool in the shower to sit on, if needed.  Keep floors dry and clean up all water on the floor immediately.  Remove soap buildup in the tub or shower on a regular basis.  Secure bath mats with non-slip, double-sided rug tape.  Remove throw rugs and tripping hazards from the floors. BEDROOMS  Install night lights.  Make sure a bedside light is easy to reach.  Do not use oversized bedding.  Keep a telephone by your bedside.  Have a firm chair with side arms to use for getting dressed.  Remove throw rugs and tripping hazards from the floor. KITCHEN  Keep handles on pots and pans turned toward the center of the stove.  Use back burners when possible.  Clean up spills quickly and allow time for drying.  Avoid walking on wet floors.  Avoid hot utensils and knives.  Position shelves so they are not too high or low.  Place commonly used objects within easy reach.  If necessary, use a sturdy step stool with a grab bar when reaching.  Keep electrical cables out of the way.  Do not use floor polish or wax that makes floors slippery. If you must use wax, use non-skid floor wax.  Remove throw rugs and tripping hazards from the floor. STAIRWAYS  Never leave objects on stairs.  Place handrails on both sides of stairways and use them. Fix any loose handrails. Make sure handrails on both sides of the stairways are as long as the stairs.  Check carpeting to make sure it is firmly attached along stairs. Make repairs to worn or loose carpet promptly.  Avoid placing throw rugs at the top or bottom of stairways, or properly secure the rug with carpet tape to prevent slippage. Get rid of throw rugs, if possible.  Have an electrician put in a light switch at the top and bottom of the stairs. OTHER FALL PREVENTION TIPS  Wear low-heel or rubber-soled shoes that are supportive and fit well. Wear closed toe shoes.  When using a stepladder, make sure it is fully opened and both spreaders are firmly locked. Do not climb a closed  stepladder.  Add color or contrast paint or tape to grab bars and handrails in your home. Place contrasting color strips on first and last steps.  Learn and use mobility aids as needed. Install an electrical emergency response system.  Turn on lights to avoid dark areas. Replace light bulbs that burn out immediately. Get light switches that glow.  Arrange furniture to create clear pathways. Keep furniture in the same place.  Firmly attach carpet with non-skid or double-sided tape.  Eliminate uneven floor surfaces.  Select a carpet pattern that does not visually hide the edge of  steps.  Be aware of all pets. OTHER HOME SAFETY TIPS  Set the water temperature for 120 F (48.8 C).  Keep emergency numbers on or near the telephone.  Keep smoke detectors on every level of the home and near sleeping areas. Document Released: 03/19/2002 Document Revised: 09/28/2011 Document Reviewed: 06/18/2011 [facility] Patient Information 2015 [place], [place]. This information is not intended to replace advice given to you by your health care provider. Make sure you discuss any questions you have with your health care provider. [facility] [address], [place], [place] [address] Phone # [phone] Fax [phone]

## 2016-09-08 ENCOUNTER — Ambulatory Visit: Payer: Medicare Other | Admitting: Physical Therapy

## 2016-09-08 DIAGNOSIS — M6281 Muscle weakness (generalized): Secondary | ICD-10-CM | POA: Diagnosis not present

## 2016-09-08 DIAGNOSIS — M79671 Pain in right foot: Secondary | ICD-10-CM | POA: Diagnosis not present

## 2016-09-08 NOTE — Therapy (Signed)
[facility] [address], [place], [place], [address] Phone: [phone]   Fax:  [phone]  Physical Therapy Treatment  Patient Details  Name: [name] Ruiz MRN: [id number] Date of Birth: [birth year] Referring Provider: Dr. Jonathon Jordan  Encounter Date: 09/08/2016      [name] Ruiz End of Session - 09/08/16 1307    Visit Number 2   Number of Visits 10   Date for [name] Ruiz Re-Evaluation 10/25/16   Authorization Type medicare g-code 10th visit; KX on 15th visit   [name] Ruiz Start Time 1230   [name] Ruiz Stop Time 1308   [name] Ruiz Time Calculation (min) 38 min   Activity Tolerance Patient tolerated treatment well   Behavior During Therapy [facility] for tasks assessed/performed      Past Medical History:  Diagnosis Date  . Arthritis   . Constipation due to opioid therapy   . Hypothyroidism   . Scoliosis     Past Surgical History:  Procedure Laterality Date  . Jaw wired     12/09/15- 25 years ago  . ORIF SHOULDER FRACTURE Left 12/10/2015   Procedure: OPEN REDUCTION INTERNAL FIXATION (ORIF) LEFT  SHOULDER FRACTURE;  Surgeon: Netta Cedars, MD;  Location: [place];  Service: Orthopedics;  Laterality: Left;  . Orthodontia     with jaw fracture    There were no vitals filed for this visit.      Subjective Assessment - 09/08/16 1234    Subjective My hip hurts and may be due to carrying items and I wore new shoes.    Patient Stated Goals Improve my balance   Currently in Pain? Yes   Pain Score 3    Pain Location Foot   Pain Orientation Right   Pain Descriptors / Indicators Sharp   Pain Type Chronic pain   Pain Onset More than a month ago   Pain Frequency Intermittent   Aggravating Factors  quick movement, dancing moves   Pain Relieving Factors stretching right foot   Multiple Pain Sites No                         [facility] Adult [name] Ruiz Treatment/Exercise - 09/08/16 0001      Exercises   Exercises Other Exercises   Other Exercises   rebounder- bounce with heel raises, feet wide and go side to side, then one foot ahead of the other 1 min each     Manual Therapy   Manual Therapy Other (comment)   Other Manual Therapy manually stretched right hip for hip rotators, hamstring, hip adductors                [name] Ruiz Education - 09/08/16 1256    Education provided Yes   Education Details ankle plantarflexion; balance program   Person(s) Educated Patient   Methods Explanation;Demonstration;Verbal cues;Handout   Comprehension Returned demonstration;Verbalized understanding          [name] Ruiz Short Term Goals - 09/08/16 1313      [name] Ruiz SHORT TERM GOAL #1   Title independent with initial HEP   Time 4   Period Weeks   Status Achieved     [name] Ruiz SHORT TERM GOAL #2   Title understand the do's and do not of osteoporosis to protect her back and bones   Time 4   Period Weeks   Status New     [name] Ruiz SHORT TERM GOAL #3   Title anxiety of fear of falling reduced >/= 25% with daily activities  due to increased right ankle strength   Time 4   Period Weeks   Status On-going           [name] Ruiz Long Term Goals - 08/30/16 1529      [name] Ruiz LONG TERM GOAL #1   Title education on precautions with osteoporosis with lifting and posture   Time 8   Period Weeks   Status New     [name] Ruiz LONG TERM GOAL #2   Title understand correct posture with yoga postures to decrease strain on spine and work on balance   Time 8   Period Weeks   Status New     [name] Ruiz LONG TERM GOAL #3   Title gardening with improved balance so you do not fall over   Time 8   Period Weeks   Status New     [name] Ruiz LONG TERM GOAL #4   Title strengthen right ankle to improve fear of falling >/= 75%    Time 8   Period Weeks   Status New     [name] Ruiz LONG TERM GOAL #5   Title stepping on right foot with reduction of anxiety of falling >/= 75%   Time 8   Period Weeks   Status New               Plan - 09/08/16 1307    Clinical Impression Statement Stretching right hip out and  reduce right hip. Patient is learning her HEP for balance and ankle strengthening. Patient has just started her program so has not met goals yet. Patient will benefit from skilled therapy to improve right ankle strength and improve balance.    Rehab Potential Excellent   Clinical Impairments Affecting Rehab Potential osteoporosis   [name] Ruiz Frequency 1x / week   [name] Ruiz Duration 8 weeks   [name] Ruiz Treatment/Interventions Cryotherapy;Moist Heat;Therapeutic exercise;Therapeutic activities;Neuromuscular re-education;Patient/family education;Manual techniques;Taping   [name] Ruiz Next Visit Plan balance exercises, yoga posture with osteoporosis, , ways to manage osteoporosis   [name] Ruiz Home Exercise Plan progress as needed   Recommended Other Services MD note signed on 09/07/2016   Consulted and Agree with Plan of Care Patient      Patient will benefit from skilled therapeutic intervention in order to improve the following deficits and impairments:  Pain, Decreased balance, Decreased strength, Decreased mobility  Visit Diagnosis: Muscle weakness (generalized)  Pain in right foot     Problem List Patient Active Problem List   Diagnosis Date Noted  . Proximal humerus fracture 12/10/2015  . Right foot pain 09/26/2010  . Pes planus 09/26/2010  . Metatarsalgia 09/26/2010    [name] Ruiz, [name] Ruiz 09/08/16 1:14 PM   [facility] [address] [place], [place], [address] Phone: [phone]   Fax:  [phone]  Name: [name] Ruiz MRN: [id number] Date of Birth: 1945-12-02

## 2016-09-08 NOTE — Patient Instructions (Addendum)
   Start: Place theraband around involved foot as pictured.  Movement: Press into the band against the resistance (as if you are pressing the gas pedal) control the movement back to start. 20 times slowly 1 time per day.     Start: Place theraband around involved foot, stabilize band with non-involved foot as pictured.  Movement: Pull involved foot against resistance of the band and control movement back to start. 30 times going slowly   Reach in overhead shelf bringing cups from high shelf to counter then back 2-3 times per day. To further challenge yourself step on a pillow. Do not lean against counter. Can use a cushion.   Balance: Three-Way Leg Swing    Stand on left foot, hands on hips. Reach other foot forward __1__ times, sideways __1__ times, back ___1_ times. Hold each position __1__ seconds. Relax. Then do all three positions 10 x then switch legs.   Do __1__ sessions per day.  http://orth.exer.us/86   Copyright  VHI. All rights reserved.   [facility] [address] [place], [place] [address] Phone # [phone] Fax [phone]

## 2016-09-09 DIAGNOSIS — M9901 Segmental and somatic dysfunction of cervical region: Secondary | ICD-10-CM | POA: Diagnosis not present

## 2016-09-09 DIAGNOSIS — M542 Cervicalgia: Secondary | ICD-10-CM | POA: Diagnosis not present

## 2016-09-09 DIAGNOSIS — M5030 Other cervical disc degeneration, unspecified cervical region: Secondary | ICD-10-CM | POA: Diagnosis not present

## 2016-09-09 DIAGNOSIS — M5413 Radiculopathy, cervicothoracic region: Secondary | ICD-10-CM | POA: Diagnosis not present

## 2016-09-13 ENCOUNTER — Encounter: Payer: Self-pay | Admitting: Physical Therapy

## 2016-09-13 ENCOUNTER — Ambulatory Visit: Payer: Medicare Other | Attending: Family Medicine | Admitting: Physical Therapy

## 2016-09-13 DIAGNOSIS — M9901 Segmental and somatic dysfunction of cervical region: Secondary | ICD-10-CM | POA: Diagnosis not present

## 2016-09-13 DIAGNOSIS — M542 Cervicalgia: Secondary | ICD-10-CM | POA: Diagnosis not present

## 2016-09-13 DIAGNOSIS — M6281 Muscle weakness (generalized): Secondary | ICD-10-CM | POA: Diagnosis not present

## 2016-09-13 DIAGNOSIS — M79671 Pain in right foot: Secondary | ICD-10-CM | POA: Insufficient documentation

## 2016-09-13 DIAGNOSIS — M5413 Radiculopathy, cervicothoracic region: Secondary | ICD-10-CM | POA: Diagnosis not present

## 2016-09-13 DIAGNOSIS — M5030 Other cervical disc degeneration, unspecified cervical region: Secondary | ICD-10-CM | POA: Diagnosis not present

## 2016-09-13 NOTE — Patient Instructions (Addendum)
DO's and DON'T's   Avoid and/or Minimize positions of forward bending ( flexion)  Side bending and rotation of the trunk  Especially when movements occur together   When your back aches:   Don't sit down   Lie down on your back with a small pillow under your head and one under your knees or as outlined by our therapist. Or, lie in the 90/90 position ( on the floor with your feet and legs on the sofa with knees and hips bent to 90 degrees)  Tying or putting on your shoes:   Don't bend over to tie your shoes or put on socks.  Instead, bring one foot up, cross it over the opposite knee and bend forward (hinge) at the hips to so the task.  Keep your back straight.  If you cannot do this safely, then you need to use long handled assistive devices such as a shoehorn and sock puller.  Exercising:  Don't engage in ballistic types of exercise routines such as high-impact aerobics or jumping rope  Don't do exercises in the gym that bring you forward (abdominal crunches, sit-ups, touching your  toes, knee-to-chest, straight leg raising.)  Follow a regular exercise program that includes a variety of different weight-bearing activities, such as low-impact aerobics, T' ai chi or walking as your physical therapist advises  Do exercises that emphasize return to normal body alignment and strengthening of the muscles that keep your back straight, as outlined in this program or by your therapist  Household tasks:  Don't reach unnecessarily or twist your trunk when mopping, sweeping, vacuuming, raking, making beds, weeding gardens, getting objects ou of cupboards, etc.  Keep your broom, mop, vacuum, or rake close to you and mover your whole body as you move them. Walk over to the area on which you are working. Arrange kitchen, bathroom, and bedroom shelves so that frequently used items may be reached without excessive bending, twisting, and reaching.  Use a  sturdy stool if necessary.  Don't bend from the waist to pick up something up  Off the floor, out of the trunk of your car, or to brush your teeth, wash your face, etc.   Bend at the knees, keeping back straight as possible. Use a reacher if necessary.   Prevention of fracture is the so-called "BOTTOm -Line" in the management of OSTEOPOROSIS. Do not take unnecessary chances in movement. Once a compression fracture occurs, the process is very difficult to control; one fracture is frequently followed by many more.   [facility] [address], [address] [place], [place] [address] Phone # [phone] Fax [phone] 

## 2016-09-13 NOTE — Therapy (Signed)
[facility] [address], [place], [place], [address] Phone: [phone]   Fax:  [phone]  Physical Therapy Treatment  Patient Details  Name: TANIA STEINHAUSER MRN: [id number] Date of Birth: 1946/03/05 Referring Provider: Dr. Jonathon Jordan  Encounter Date: 09/13/2016      PT End of Session - 09/13/16 1526    Visit Number 3   Number of Visits 10   Date for PT Re-Evaluation 10/25/16   PT Start Time 6962   PT Stop Time 1526   PT Time Calculation (min) 41 min   Activity Tolerance Patient tolerated treatment well   Behavior During Therapy [facility] for tasks assessed/performed      Past Medical History:  Diagnosis Date  . Arthritis   . Constipation due to opioid therapy   . Hypothyroidism   . Scoliosis     Past Surgical History:  Procedure Laterality Date  . Jaw wired     12/09/15- 25 years ago  . ORIF SHOULDER FRACTURE Left 12/10/2015   Procedure: OPEN REDUCTION INTERNAL FIXATION (ORIF) LEFT  SHOULDER FRACTURE;  Surgeon: Netta Cedars, MD;  Location: [place];  Service: Orthopedics;  Laterality: Left;  . Orthodontia     with jaw fracture    There were no vitals filed for this visit.      Subjective Assessment - 09/13/16 1447    Subjective My hip is a little better than is was.  My foot is okay.    Patient Stated Goals Improve my balance   Currently in Pain? Yes   Pain Score 2   hip is 3/10   Pain Location Foot   Pain Orientation Right   Pain Descriptors / Indicators Sharp   Pain Type Chronic pain   Pain Onset More than a month ago   Pain Frequency Intermittent   Aggravating Factors  quick movement, dancing moves   Pain Relieving Factors stretching right foot   Multiple Pain Sites No                         [facility] Adult PT Treatment/Exercise - 09/13/16 0001      Therapeutic Activites    Therapeutic Activities Other Therapeutic Activities   Other Therapeutic Activities instructed  patient on correct posture with yoga movements to decrease strain and pressure of the spinal column     Exercises   Other Exercises  ankle inversion with yellow band     Manual Therapy   Manual Therapy Soft tissue mobilization   Other Manual Therapy bottom of right foot, plantar fascia; small muscles of the foot                PT Education - 09/13/16 1508    Education provided Yes   Education Details reviewed past HEP and ankle exercise to do daily; information on osteoporosis   Person(s) Educated Patient   Methods Explanation   Comprehension Verbalized understanding;Returned demonstration          PT Short Term Goals - 09/13/16 1451      PT SHORT TERM GOAL #2   Title understand the do's and do not of osteoporosis to protect her back and bones   Time 4   Period Weeks   Status Achieved           PT Long Term Goals - 08/30/16 1529      PT LONG TERM GOAL #1   Title education on precautions with osteoporosis with lifting  and posture   Time 8   Period Weeks   Status New     PT LONG TERM GOAL #2   Title understand correct posture with yoga postures to decrease strain on spine and work on balance   Time 8   Period Weeks   Status New     PT LONG TERM GOAL #3   Title gardening with improved balance so you do not fall over   Time 8   Period Weeks   Status New     PT LONG TERM GOAL #4   Title strengthen right ankle to improve fear of falling >/= 75%    Time 8   Period Weeks   Status New     PT LONG TERM GOAL #5   Title stepping on right foot with reduction of anxiety of falling >/= 75%   Time 8   Period Weeks   Status New               Plan - 09/13/16 1528    Clinical Impression Statement Patient understands what she can and cannot do with osteoporosis. Patient understands correct posture with her yoga movements to decrease strain on the back and bones.  Patient will benefit from silled therapy to improve right ankle strength and improve  balance.    Rehab Potential Excellent   Clinical Impairments Affecting Rehab Potential osteoporosis   PT Frequency 1x / week   PT Duration 8 weeks   PT Treatment/Interventions Cryotherapy;Moist Heat;Therapeutic exercise;Therapeutic activities;Neuromuscular re-education;Patient/family education;Manual techniques;Taping   PT Next Visit Plan balance exercises,   PT Home Exercise Plan progress as needed      Patient will benefit from skilled therapeutic intervention in order to improve the following deficits and impairments:  Pain, Decreased balance, Decreased strength, Decreased mobility  Visit Diagnosis: Muscle weakness (generalized)  Pain in right foot     Problem List Patient Active Problem List   Diagnosis Date Noted  . Proximal humerus fracture 12/10/2015  . Right foot pain 09/26/2010  . Pes planus 09/26/2010  . Metatarsalgia 09/26/2010    Earlie Counts, PT 09/13/16 3:32 PM   [facility] [address] [place], [place], [address] Phone: [phone]   Fax:  [phone]  Name: WESLEE PRESTAGE MRN: [id number] Date of Birth: April 01, 1946

## 2016-09-21 ENCOUNTER — Ambulatory Visit: Payer: Medicare Other | Admitting: Physical Therapy

## 2016-09-21 ENCOUNTER — Encounter: Payer: Self-pay | Admitting: Physical Therapy

## 2016-09-21 DIAGNOSIS — M79671 Pain in right foot: Secondary | ICD-10-CM | POA: Diagnosis not present

## 2016-09-21 DIAGNOSIS — M5030 Other cervical disc degeneration, unspecified cervical region: Secondary | ICD-10-CM | POA: Diagnosis not present

## 2016-09-21 DIAGNOSIS — S42295D Other nondisplaced fracture of upper end of left humerus, subsequent encounter for fracture with routine healing: Secondary | ICD-10-CM | POA: Diagnosis not present

## 2016-09-21 DIAGNOSIS — M542 Cervicalgia: Secondary | ICD-10-CM | POA: Diagnosis not present

## 2016-09-21 DIAGNOSIS — M6281 Muscle weakness (generalized): Secondary | ICD-10-CM

## 2016-09-21 DIAGNOSIS — M5413 Radiculopathy, cervicothoracic region: Secondary | ICD-10-CM | POA: Diagnosis not present

## 2016-09-21 DIAGNOSIS — M9901 Segmental and somatic dysfunction of cervical region: Secondary | ICD-10-CM | POA: Diagnosis not present

## 2016-09-21 NOTE — Therapy (Signed)
[facility] [address], [place], [place], [address] Phone: [phone]   Fax:  [phone]  Physical Therapy Treatment  Patient Details  Name: [name] Ruiz MRN: [id number] Date of Birth: [birth year] Referring Provider: Dr. Jonathon Jordan  Encounter Date: 09/21/2016      PT End of Session - 09/21/16 1442    Visit Number 4   Number of Visits 10   Date for PT Re-Evaluation 10/25/16   Authorization Type medicare g-code 10th visit; KX on 15th visit   PT Start Time 1400   PT Stop Time 1441   PT Time Calculation (min) 41 min   Activity Tolerance Patient tolerated treatment well   Behavior During Therapy [facility] for tasks assessed/performed      Past Medical History:  Diagnosis Date  . Arthritis   . Constipation due to opioid therapy   . Hypothyroidism   . Scoliosis     Past Surgical History:  Procedure Laterality Date  . Jaw wired     12/09/15- 25 years ago  . ORIF SHOULDER FRACTURE Left 12/10/2015   Procedure: OPEN REDUCTION INTERNAL FIXATION (ORIF) LEFT  SHOULDER FRACTURE;  Surgeon: Netta Cedars, MD;  Location: [place];  Service: Orthopedics;  Laterality: Left;  . Orthodontia     with jaw fracture    There were no vitals filed for this visit.      Subjective Assessment - 09/21/16 1403    Subjective I saw Dr. Veverly Fells for my shoulder. Said my shoulder is healed perfectly and strong. My hip still hurts alittle.  I can do some dance moves.    Patient Stated Goals Improve my balance   Currently in Pain? Yes   Pain Score 3    Pain Location Foot   Pain Orientation Right   Pain Descriptors / Indicators Sharp   Pain Type Chronic pain   Pain Onset More than a month ago   Pain Frequency Intermittent   Aggravating Factors  quick movement, dancing moves   Pain Relieving Factors stretching right foot   Multiple Pain Sites No            [facility] PT Assessment - 09/21/16 0001      Strength   Right Ankle Plantar  Flexion 4+/5   Right Ankle Inversion 4+/5                     [facility] Adult PT Treatment/Exercise - 09/21/16 0001      Ankle Exercises: Standing   Side Shuffle (Round Trip) 20 feet 2 times both ways   Braiding (Round Trip) 20 feet 2 times both ways  trouble bringing left foot behind the right   Other Standing Ankle Exercises stand on right foot and pick up cones with contact guard; stand and reach 10 inches 5 x   Other Standing Ankle Exercises stand on wobble board and keep balance, rock back and forth holding on      Ankle Exercises: Seated   [place] Pickup 5 times with right foot   Other Seated Ankle Exercises move right foot back and forth without moving knee 30x; then toe tap fast in sitting.                   PT Short Term Goals - 09/13/16 1451      PT SHORT TERM GOAL #2   Title understand the do's and do not of osteoporosis to protect her back and bones   Time 4  Period Weeks   Status Achieved           PT Long Term Goals - 08/30/16 1529      PT LONG TERM GOAL #1   Title education on precautions with osteoporosis with lifting and posture   Time 8   Period Weeks   Status New     PT LONG TERM GOAL #2   Title understand correct posture with yoga postures to decrease strain on spine and work on balance   Time 8   Period Weeks   Status New     PT LONG TERM GOAL #3   Title gardening with improved balance so you do not fall over   Time 8   Period Weeks   Status New     PT LONG TERM GOAL #4   Title strengthen right ankle to improve fear of falling >/= 75%    Time 8   Period Weeks   Status New     PT LONG TERM GOAL #5   Title stepping on right foot with reduction of anxiety of falling >/= 75%   Time 8   Period Weeks   Status New               Plan - 09/21/16 1406    Clinical Impression Statement Patient reports her right foot pain is 25% since initial evaluation.  Patient is able to so quick movements with less pain. Patient  has less pain in right foot with standing yoga postures.  Patient has increased strength of right ankle for eversion and plantarflexion to 4+/5. Patient will benefit from skilled therapy to improve right ankle strength and improve balance.    Rehab Potential Excellent   Clinical Impairments Affecting Rehab Potential osteoporosis   PT Frequency 1x / week   PT Duration 8 weeks   PT Treatment/Interventions Cryotherapy;Moist Heat;Therapeutic exercise;Therapeutic activities;Neuromuscular re-education;Patient/family education;Manual techniques;Taping   PT Next Visit Plan balance exercises,   PT Home Exercise Plan progress as needed   Consulted and Agree with Plan of Care Patient      Patient will benefit from skilled therapeutic intervention in order to improve the following deficits and impairments:  Pain, Decreased balance, Decreased strength, Decreased mobility  Visit Diagnosis: Muscle weakness (generalized)  Pain in right foot     Problem List Patient Active Problem List   Diagnosis Date Noted  . Proximal humerus fracture 12/10/2015  . Right foot pain 09/26/2010  . Pes planus 09/26/2010  . Metatarsalgia 09/26/2010    Earlie Counts, PT 09/21/16 2:43 PM   [facility] [address] [place], [place], [address] Phone: [phone]   Fax:  [phone]  Name: BAYLEI SIEBELS MRN: [id number] Date of Birth: 01/20/1946

## 2016-09-27 ENCOUNTER — Ambulatory Visit: Payer: Medicare Other | Admitting: Physical Therapy

## 2016-09-27 ENCOUNTER — Encounter: Payer: Self-pay | Admitting: Physical Therapy

## 2016-09-27 DIAGNOSIS — M5413 Radiculopathy, cervicothoracic region: Secondary | ICD-10-CM | POA: Diagnosis not present

## 2016-09-27 DIAGNOSIS — M79671 Pain in right foot: Secondary | ICD-10-CM | POA: Diagnosis not present

## 2016-09-27 DIAGNOSIS — M6281 Muscle weakness (generalized): Secondary | ICD-10-CM | POA: Diagnosis not present

## 2016-09-27 DIAGNOSIS — M5030 Other cervical disc degeneration, unspecified cervical region: Secondary | ICD-10-CM | POA: Diagnosis not present

## 2016-09-27 DIAGNOSIS — M9901 Segmental and somatic dysfunction of cervical region: Secondary | ICD-10-CM | POA: Diagnosis not present

## 2016-09-27 DIAGNOSIS — M542 Cervicalgia: Secondary | ICD-10-CM | POA: Diagnosis not present

## 2016-09-27 NOTE — Therapy (Signed)
[facility] [address] [place], [place], [address] Phone: [phone]   Fax:  [phone]  Physical Therapy Treatment  Patient Details  Name: [name] Ruiz MRN: [id number] Date of Birth: [birth year] Referring Provider: Dr. Jonathon Jordan  Encounter Date: 09/27/2016      PT End of Session - 09/27/16 1522    Visit Number 5   Number of Visits 10   Date for PT Re-Evaluation 10/25/16   Authorization Type medicare g-code 10th visit; KX on 15th visit   PT Start Time 1445   PT Stop Time 1523   PT Time Calculation (min) 38 min   Activity Tolerance Patient tolerated treatment well   Behavior During Therapy [facility] for tasks assessed/performed      Past Medical History:  Diagnosis Date  . Arthritis   . Constipation due to opioid therapy   . Hypothyroidism   . Scoliosis     Past Surgical History:  Procedure Laterality Date  . Jaw wired     12/09/15- 25 years ago  . ORIF SHOULDER FRACTURE Left 12/10/2015   Procedure: OPEN REDUCTION INTERNAL FIXATION (ORIF) LEFT  SHOULDER FRACTURE;  Surgeon: Netta Cedars, MD;  Location: [facility];  Service: Orthopedics;  Laterality: Left;  . Orthodontia     with jaw fracture    There were no vitals filed for this visit.      Subjective Assessment - 09/27/16 1458    Subjective I can do more now.  I have pain with ankle circles. I have pain with standing with yoga. My pain is 20% better.    Patient Stated Goals Improve my balance   Currently in Pain? Yes   Pain Score 2    Pain Location Foot   Pain Orientation Right   Pain Descriptors / Indicators Sharp   Pain Type Chronic pain   Pain Onset More than a month ago   Pain Frequency Intermittent   Aggravating Factors  dance moves, yoga moves, quick movements   Pain Relieving Factors stretching right foot   Multiple Pain Sites No                         [facility] Adult PT Treatment/Exercise - 09/27/16 0001      Manual  Therapy   Manual Therapy Soft tissue mobilization   Soft tissue mobilization intrinsic muscles of right foot, plantarfascia     Ankle Exercises: Seated   Marble Pickup 5 times with right foot   Other Seated Ankle Exercises move right foot back and forth without moving knee 30x; then toe tap fast in sitting.      Ankle Exercises: Standing   Side Shuffle (Round Trip) 20 feet 2 times both ways   Braiding (Round Trip) 20 feet 2 times both ways  improved balance   Other Standing Ankle Exercises stand on right on oval with left foot coming up and down;  then do left hip abduction   Other Standing Ankle Exercises tree pose on right foot                  PT Short Term Goals - 09/13/16 1451      PT SHORT TERM GOAL #2   Title understand the do's and do not of osteoporosis to protect her back and bones   Time 4   Period Weeks   Status Achieved           PT Long Term Goals -  09/27/16 1523      PT LONG TERM GOAL #1   Title education on precautions with osteoporosis with lifting and posture   Period Weeks   Status On-going     PT LONG TERM GOAL #2   Title understand correct posture with yoga postures to decrease strain on spine and work on balance   Time 8   Period Weeks   Status Achieved     PT LONG TERM GOAL #3   Title gardening with improved balance so you do not fall over   Time 8   Period Weeks   Status On-going     PT LONG TERM GOAL #4   Title strengthen right ankle to improve fear of falling >/= 75%    Time 8   Period Weeks   Status On-going  25% better     PT LONG TERM GOAL #5   Title stepping on right foot with reduction of anxiety of falling >/= 75%   Time 8   Period Weeks   Status On-going  25% better               Plan - 09/27/16 1528    Clinical Impression Statement Patient had some trigger points in the right foot. After soft tissue work to bottom of right foot pain was gone.  Patient has improved single leg stance with therapy.   Patient reports she is 25% better. Patient  needs verbal cues that her steadiness in yoga poses may not be as good due ot not doing it for 6 months.  Patient will benefit from skilled therapy to improve right ankle strength and improve balance.    Rehab Potential Excellent   Clinical Impairments Affecting Rehab Potential osteoporosis   PT Frequency 1x / week   PT Duration 8 weeks   PT Treatment/Interventions Cryotherapy;Moist Heat;Therapeutic exercise;Therapeutic activities;Neuromuscular re-education;Patient/family education;Manual techniques;Taping   PT Next Visit Plan balance exercises,soft tissue work   Oncologist with Plan of Care Patient      Patient will benefit from skilled therapeutic intervention in order to improve the following deficits and impairments:  Pain, Decreased balance, Decreased strength, Decreased mobility  Visit Diagnosis: Muscle weakness (generalized)  Pain in right foot     Problem List Patient Active Problem List   Diagnosis Date Noted  . Proximal humerus fracture 12/10/2015  . Right foot pain 09/26/2010  . Pes planus 09/26/2010  . Metatarsalgia 09/26/2010    Earlie Counts, PT 09/27/16 3:32 PM   [facility] [address], [place], [place], [address] Phone: [phone]   Fax:  [phone]  Name: [name] Ruiz MRN: [id number] Date of Birth: 10-Aug-1945

## 2016-10-06 ENCOUNTER — Ambulatory Visit: Payer: Medicare Other | Admitting: Physical Therapy

## 2016-10-06 ENCOUNTER — Encounter: Payer: Self-pay | Admitting: Physical Therapy

## 2016-10-06 DIAGNOSIS — M6281 Muscle weakness (generalized): Secondary | ICD-10-CM | POA: Diagnosis not present

## 2016-10-06 DIAGNOSIS — M5413 Radiculopathy, cervicothoracic region: Secondary | ICD-10-CM | POA: Diagnosis not present

## 2016-10-06 DIAGNOSIS — M542 Cervicalgia: Secondary | ICD-10-CM | POA: Diagnosis not present

## 2016-10-06 DIAGNOSIS — M79671 Pain in right foot: Secondary | ICD-10-CM

## 2016-10-06 DIAGNOSIS — M9901 Segmental and somatic dysfunction of cervical region: Secondary | ICD-10-CM | POA: Diagnosis not present

## 2016-10-06 DIAGNOSIS — M5030 Other cervical disc degeneration, unspecified cervical region: Secondary | ICD-10-CM | POA: Diagnosis not present

## 2016-10-06 NOTE — Therapy (Signed)
[facility] [address], [place], [place], [address] Phone: [phone]   Fax:  [phone]  Physical Therapy Treatment  Patient Details  Name: [name] Ruiz MRN: [id number] Date of Birth: 30-Nov-1945 Referring Provider: Dr. Jonathon Jordan  Encounter Date: 10/06/2016      PT End of Session - 10/06/16 1239    Visit Number 6   Number of Visits 10   Date for PT Re-Evaluation 10/25/16   Authorization Type medicare g-code 10th visit; KX on 15th visit   PT Start Time 1230   PT Stop Time 1310   PT Time Calculation (min) 40 min   Activity Tolerance Patient tolerated treatment well   Behavior During Therapy [facility] for tasks assessed/performed      Past Medical History:  Diagnosis Date  . Arthritis   . Constipation due to opioid therapy   . Hypothyroidism   . Scoliosis     Past Surgical History:  Procedure Laterality Date  . Jaw wired     12/09/15- 25 years ago  . ORIF SHOULDER FRACTURE Left 12/10/2015   Procedure: OPEN REDUCTION INTERNAL FIXATION (ORIF) LEFT  SHOULDER FRACTURE;  Surgeon: Netta Cedars, MD;  Location: [facility];  Service: Orthopedics;  Laterality: Left;  . Orthodontia     with jaw fracture    There were no vitals filed for this visit.      Subjective Assessment - 10/06/16 1240    Subjective I saw the chiropractor this morning due to hip pain.  I feel more balanced. Walking and dance steps is getting easier.    Patient Stated Goals Improve my balance   Currently in Pain? Yes   Pain Score 2    Pain Location Foot   Pain Orientation Right   Pain Descriptors / Indicators Sharp   Pain Type Chronic pain   Pain Onset More than a month ago   Pain Frequency Intermittent   Aggravating Factors  dance moves, yoga moves, quick movement   Pain Relieving Factors stretching right foot   Multiple Pain Sites No            [facility] PT Assessment - 10/06/16 0001      Strength   Right Ankle Plantar Flexion  4+/5   Right Ankle Inversion 4+/5                     [facility] Adult PT Treatment/Exercise - 10/06/16 0001      Exercises   Other Exercises  go up and down steps with alternate pattern going slowly to challange balance; stand on left with red band around left ankle and move back and forth     Manual Therapy   Manual Therapy Soft tissue mobilization   Soft tissue mobilization intrinsic muscles of right foot, plantarfascia     Ankle Exercises: Seated   Marble Pickup 5 times with right foot   Other Seated Ankle Exercises move right foot back and forth without moving knee 30x; then toe tap fast in sitting.      Ankle Exercises: Standing   Balance Beam tandem walk on carpet 8 feet x2 with increased steadiness   Other Standing Ankle Exercises stand on right on oval with left foot coming up and down;  then do left hip abduction   Other Standing Ankle Exercises tree pose on right foot while standing on the blue oval  PT Short Term Goals - 10/06/16 1243      PT SHORT TERM GOAL #3   Title anxiety of fear of falling reduced >/= 25% with daily activities due to increased right ankle strength   Time 4   Period Weeks   Status Achieved           PT Long Term Goals - 10/06/16 1244      PT LONG TERM GOAL #1   Title education on precautions with osteoporosis with lifting and posture   Time 8   Period Weeks   Status Achieved     PT LONG TERM GOAL #4   Title strengthen right ankle to improve fear of falling >/= 75%    Time 8   Period Weeks   Status On-going  25% limitation     PT LONG TERM GOAL #5   Title stepping on right foot with reduction of anxiety of falling >/= 75%   Time 8   Period Weeks   Status On-going  25% better               Plan - 10/06/16 1239    Clinical Impression Statement Patient feels 25% steadier due to improved balance.  Patient is able to do tandem walk with greater ease.  Patient had minimal tenderness  located in right foot.  Patient will benefit from skilled therapy to improve right ankle strength and improve balance.    Rehab Potential Excellent   Clinical Impairments Affecting Rehab Potential osteoporosis   PT Frequency 1x / week   PT Duration 8 weeks   PT Treatment/Interventions Cryotherapy;Moist Heat;Therapeutic exercise;Therapeutic activities;Neuromuscular re-education;Patient/family education;Manual techniques;Taping   PT Next Visit Plan balance exercises,soft tissue work   PT Home Exercise Plan progress as needed   Consulted and Agree with Plan of Care Patient      Patient will benefit from skilled therapeutic intervention in order to improve the following deficits and impairments:  Pain, Decreased balance, Decreased strength, Decreased mobility  Visit Diagnosis: Muscle weakness (generalized)  Pain in right foot     Problem List Patient Active Problem List   Diagnosis Date Noted  . Proximal humerus fracture 12/10/2015  . Right foot pain 09/26/2010  . Pes planus 09/26/2010  . Metatarsalgia 09/26/2010    Earlie Counts, PT 10/06/16 1:17 PM   [facility] [address], [place], [place], [address] Phone: [phone]   Fax:  [phone]  Name: [name] Ruiz MRN: [id number] Date of Birth: [birth year]

## 2016-10-12 DIAGNOSIS — M21961 Unspecified acquired deformity of right lower leg: Secondary | ICD-10-CM | POA: Diagnosis not present

## 2016-10-12 DIAGNOSIS — M21962 Unspecified acquired deformity of left lower leg: Secondary | ICD-10-CM | POA: Diagnosis not present

## 2016-10-14 DIAGNOSIS — M5413 Radiculopathy, cervicothoracic region: Secondary | ICD-10-CM | POA: Diagnosis not present

## 2016-10-14 DIAGNOSIS — M542 Cervicalgia: Secondary | ICD-10-CM | POA: Diagnosis not present

## 2016-10-14 DIAGNOSIS — M9901 Segmental and somatic dysfunction of cervical region: Secondary | ICD-10-CM | POA: Diagnosis not present

## 2016-10-14 DIAGNOSIS — M5030 Other cervical disc degeneration, unspecified cervical region: Secondary | ICD-10-CM | POA: Diagnosis not present

## 2016-10-21 ENCOUNTER — Ambulatory Visit: Payer: Medicare Other | Attending: Family Medicine | Admitting: Physical Therapy

## 2016-10-21 ENCOUNTER — Encounter: Payer: Self-pay | Admitting: Physical Therapy

## 2016-10-21 DIAGNOSIS — M79671 Pain in right foot: Secondary | ICD-10-CM | POA: Diagnosis not present

## 2016-10-21 DIAGNOSIS — M6281 Muscle weakness (generalized): Secondary | ICD-10-CM | POA: Insufficient documentation

## 2016-10-21 NOTE — Therapy (Signed)
[facility] [address] [place], [place], [address] Phone: [phone]   Fax:  [phone]  Physical Therapy Treatment  Patient Details  Name: [name] Ruiz MRN: [id number] Date of Birth: 21-Feb-1946 Referring Provider: Dr. Jonathon Jordan  Encounter Date: 10/21/2016      PT End of Session - 10/21/16 1243    Visit Number 7   Number of Visits 10   Date for PT Re-Evaluation 10/25/16   Authorization Type medicare g-code 10th visit; KX on 15th visit   PT Start Time 1240  therapist was on phone with a patient so was late    PT Stop Time 1315   PT Time Calculation (min) 35 min   Activity Tolerance Patient tolerated treatment well   Behavior During Therapy [facility] for tasks assessed/performed      Past Medical History:  Diagnosis Date  . Arthritis   . Constipation due to opioid therapy   . Hypothyroidism   . Scoliosis     Past Surgical History:  Procedure Laterality Date  . Jaw wired     12/09/15- 25 years ago  . ORIF SHOULDER FRACTURE Left 12/10/2015   Procedure: OPEN REDUCTION INTERNAL FIXATION (ORIF) LEFT  SHOULDER FRACTURE;  Surgeon: Netta Cedars, MD;  Location: [facility];  Service: Orthopedics;  Laterality: Left;  . Orthodontia     with jaw fracture    There were no vitals filed for this visit.          [facility] PT Assessment - 10/21/16 0001      Assessment   Medical Diagnosis M80.00XA osteoporosis with fracture; V76.160V Navicular fracture, foot   Referring Provider Dr. Jonathon Jordan   Hand Dominance Right   Prior Therapy not for balance     Precautions   Precautions Other (comment)   Precaution Comments osteoporosis     Restrictions   Weight Bearing Restrictions No     Balance Screen   Has the patient fallen in the past 6 months No   Has the patient had a decrease in activity level because of a fear of falling?  No   Is the patient reluctant to leave their home because of a fear of falling?   No     Home Ecologist residence     Prior Function   Level of Independence Independent     Cognition   Overall Cognitive Status Within Functional Limits for tasks assessed     Observation/Other Assessments   Focus on Therapeutic Outcomes (FOTO)  41% limitation     AROM   Right Ankle Dorsiflexion 20   Right Ankle Inversion 18   Right Ankle Eversion 15     Strength   Right Ankle Plantar Flexion 5/5   Right Ankle Inversion 5/5     Transfers   Transfers Not assessed     Ambulation/Gait   Ambulation/Gait No                     [facility] Adult PT Treatment/Exercise - 10/21/16 0001      Exercises   Other Exercises  went over the yoga she is doing at home and how to improve the postures and challenge her right ankle                PT Education - 10/21/16 1314    Education provided Yes   Education Details reviewed HEP and yoga moves that will challenge her ankle   Person(s)  Educated Patient   Methods Explanation   Comprehension Verbalized understanding;Returned demonstration          PT Short Term Goals - 10/06/16 1243      PT SHORT TERM GOAL #3   Title anxiety of fear of falling reduced >/= 25% with daily activities due to increased right ankle strength   Time 4   Period Weeks   Status Achieved           PT Long Term Goals - Oct 29, 2016 1245      PT LONG TERM GOAL #1   Title education on precautions with osteoporosis with lifting and posture   Time 8   Period Weeks   Status Achieved     PT LONG TERM GOAL #2   Title understand correct posture with yoga postures to decrease strain on spine and work on balance   Time 8   Period Weeks   Status Achieved     PT LONG TERM GOAL #3   Title gardening with improved balance so you do not fall over   Time 8   Period Weeks   Status Achieved     PT LONG TERM GOAL #4   Title strengthen right ankle to improve fear of falling >/= 75%    Time 8   Period Weeks    Status Achieved     PT LONG TERM GOAL #5   Title stepping on right foot with reduction of anxiety of falling >/= 75%   Time 8   Period Weeks   Status Achieved               Plan - 10/29/16 1315    Clinical Impression Statement Patient has met all of her goals.  Patient has increased right ankle ROM and strength. Patient has increased balance.  Patient is ready for discharge.    Rehab Potential Excellent   Clinical Impairments Affecting Rehab Potential osteoporosis   PT Frequency 1x / week   PT Duration 8 weeks   PT Treatment/Interventions Cryotherapy;Moist Heat;Therapeutic exercise;Therapeutic activities;Neuromuscular re-education;Patient/family education;Manual techniques;Taping   PT Next Visit Plan Discharge to HEP   PT Home Exercise Plan Current HEP    Consulted and Agree with Plan of Care Patient      Patient will benefit from skilled therapeutic intervention in order to improve the following deficits and impairments:  Pain, Decreased balance, Decreased strength, Decreased mobility  Visit Diagnosis: Muscle weakness (generalized)  Pain in right foot       G-Codes - 2016/10/29 1318    Functional Assessment Tool Used (Outpatient Only) FOTO score is 41% limitation   Functional Limitation Mobility: Walking and moving around   Mobility: Walking and Moving Around Goal Status [phone]) At least 20 percent but less than 40 percent impaired, limited or restricted   Mobility: Walking and Moving Around Discharge Status [phone]) At least 40 percent but less than 60 percent impaired, limited or restricted      Problem List Patient Active Problem List   Diagnosis Date Noted  . Proximal humerus fracture 12/10/2015  . Right foot pain 09/26/2010  . Pes planus 09/26/2010  . Metatarsalgia 09/26/2010   Earlie Counts, PT 10-29-16 1:20 PM   [facility] [address] [place], [place], [address] Phone: [phone]   Fax:   [phone]  Name: [name] Ruiz MRN: [id number] Date of Birth: [birth year]  PHYSICAL THERAPY DISCHARGE SUMMARY  Visits from Start of Care: 7  Current functional level related to  goals / functional outcomes: See above.   Remaining deficits: See above  Education / Equipment: HEP Plan: Patient agrees to discharge.  Patient goals were met. Patient is being discharged due to meeting the stated rehab goals. Thank you for the referral. Earlie Counts, PT 10/21/16 1:20 PM   ?????

## 2016-10-25 ENCOUNTER — Ambulatory Visit: Payer: Medicare Other | Admitting: Physical Therapy

## 2016-10-25 DIAGNOSIS — M542 Cervicalgia: Secondary | ICD-10-CM | POA: Diagnosis not present

## 2016-10-25 DIAGNOSIS — M5413 Radiculopathy, cervicothoracic region: Secondary | ICD-10-CM | POA: Diagnosis not present

## 2016-10-25 DIAGNOSIS — M5030 Other cervical disc degeneration, unspecified cervical region: Secondary | ICD-10-CM | POA: Diagnosis not present

## 2016-10-25 DIAGNOSIS — M9901 Segmental and somatic dysfunction of cervical region: Secondary | ICD-10-CM | POA: Diagnosis not present

## 2016-11-10 DIAGNOSIS — M5030 Other cervical disc degeneration, unspecified cervical region: Secondary | ICD-10-CM | POA: Diagnosis not present

## 2016-11-10 DIAGNOSIS — M542 Cervicalgia: Secondary | ICD-10-CM | POA: Diagnosis not present

## 2016-11-10 DIAGNOSIS — M9901 Segmental and somatic dysfunction of cervical region: Secondary | ICD-10-CM | POA: Diagnosis not present

## 2016-11-10 DIAGNOSIS — M5413 Radiculopathy, cervicothoracic region: Secondary | ICD-10-CM | POA: Diagnosis not present

## 2017-01-05 DIAGNOSIS — M21961 Unspecified acquired deformity of right lower leg: Secondary | ICD-10-CM | POA: Diagnosis not present

## 2017-01-05 DIAGNOSIS — M21962 Unspecified acquired deformity of left lower leg: Secondary | ICD-10-CM | POA: Diagnosis not present

## 2017-01-21 DIAGNOSIS — Z23 Encounter for immunization: Secondary | ICD-10-CM | POA: Diagnosis not present

## 2017-03-28 DIAGNOSIS — E559 Vitamin D deficiency, unspecified: Secondary | ICD-10-CM | POA: Diagnosis not present

## 2017-03-28 DIAGNOSIS — E063 Autoimmune thyroiditis: Secondary | ICD-10-CM | POA: Diagnosis not present

## 2017-03-28 DIAGNOSIS — R7989 Other specified abnormal findings of blood chemistry: Secondary | ICD-10-CM | POA: Diagnosis not present

## 2017-03-28 DIAGNOSIS — R5383 Other fatigue: Secondary | ICD-10-CM | POA: Diagnosis not present

## 2017-04-19 DIAGNOSIS — D72819 Decreased white blood cell count, unspecified: Secondary | ICD-10-CM | POA: Diagnosis not present

## 2017-08-03 DIAGNOSIS — D72819 Decreased white blood cell count, unspecified: Secondary | ICD-10-CM | POA: Diagnosis not present

## 2017-08-04 ENCOUNTER — Other Ambulatory Visit: Payer: Self-pay

## 2017-08-04 ENCOUNTER — Encounter (HOSPITAL_BASED_OUTPATIENT_CLINIC_OR_DEPARTMENT_OTHER): Payer: Self-pay | Admitting: Adult Health

## 2017-08-04 ENCOUNTER — Emergency Department (HOSPITAL_BASED_OUTPATIENT_CLINIC_OR_DEPARTMENT_OTHER): Payer: Medicare Other

## 2017-08-04 ENCOUNTER — Emergency Department (HOSPITAL_BASED_OUTPATIENT_CLINIC_OR_DEPARTMENT_OTHER)
Admission: EM | Admit: 2017-08-04 | Discharge: 2017-08-04 | Disposition: A | Discharge status: Home / Self Care | Payer: Medicare Other | Attending: Emergency Medicine | Admitting: Emergency Medicine

## 2017-08-04 DIAGNOSIS — Y999 Unspecified external cause status: Secondary | ICD-10-CM | POA: Insufficient documentation

## 2017-08-04 DIAGNOSIS — S42201A Unspecified fracture of upper end of right humerus, initial encounter for closed fracture: Secondary | ICD-10-CM | POA: Diagnosis not present

## 2017-08-04 DIAGNOSIS — Z79899 Other long term (current) drug therapy: Secondary | ICD-10-CM | POA: Insufficient documentation

## 2017-08-04 DIAGNOSIS — Y9301 Activity, walking, marching and hiking: Secondary | ICD-10-CM | POA: Diagnosis not present

## 2017-08-04 DIAGNOSIS — M25511 Pain in right shoulder: Secondary | ICD-10-CM | POA: Diagnosis not present

## 2017-08-04 DIAGNOSIS — S4981XA Other specified injuries of right shoulder and upper arm, initial encounter: Secondary | ICD-10-CM | POA: Diagnosis present

## 2017-08-04 DIAGNOSIS — E039 Hypothyroidism, unspecified: Secondary | ICD-10-CM | POA: Diagnosis not present

## 2017-08-04 DIAGNOSIS — S42211A Unspecified displaced fracture of surgical neck of right humerus, initial encounter for closed fracture: Secondary | ICD-10-CM | POA: Insufficient documentation

## 2017-08-04 DIAGNOSIS — W010XXA Fall on same level from slipping, tripping and stumbling without subsequent striking against object, initial encounter: Secondary | ICD-10-CM | POA: Diagnosis not present

## 2017-08-04 DIAGNOSIS — Y9289 Other specified places as the place of occurrence of the external cause: Secondary | ICD-10-CM | POA: Insufficient documentation

## 2017-08-04 MED ORDER — ACETAMINOPHEN 500 MG PO TABS
1000.0000 mg | ORAL_TABLET | Freq: Once | ORAL | Status: AC
  Administered 2017-08-04: 1000 mg via ORAL
  Filled 2017-08-04: qty 2

## 2017-08-04 MED ORDER — OXYCODONE HCL 5 MG PO TABS: 5.0000 mg | ORAL_TABLET | ORAL | 0 refills | Status: DC | PRN

## 2017-08-04 MED ORDER — OXYCODONE HCL 5 MG PO TABS
2.5000 mg | ORAL_TABLET | Freq: Once | ORAL | Status: AC
  Administered 2017-08-04: 2.5 mg via ORAL
  Filled 2017-08-04: qty 1

## 2017-08-04 NOTE — ED Provider Notes (Signed)
[facility]ERGENC[facility] DEPARTMENT Provider Note   CSN: [id number] Arrival date & time: 08/04/17  1932     History   Chief Complaint Chief Complaint  Patient presents with  . Fall    HPI [name] Ruiz is a 72 y.o. female.  72 yo F with a chief complaint of right shoulder pain.  The patient was walking on her property and tripped on a piece of trash and fell onto her right arm.  She denies head injury loss of consciousness neck pain back pain chest pain abdominal pain or lower extremity pain.  She has pain to the upper part of her arm every time she tries to move it.  Will decide to come to the emergency department.  The history is provided by the patient.  Injury  This is a new problem. The current episode started 3 to 5 hours ago. The problem occurs constantly. The problem has not changed since onset.Pertinent negatives include no chest pain, no headaches and no shortness of breath. The symptoms are aggravated by bending and twisting. Nothing relieves the symptoms. She has tried nothing for the symptoms. The treatment provided no relief.    Past Medical History:  Diagnosis Date  . Arthritis   . Constipation due to opioid therapy   . Hypothyroidism   . Scoliosis     Patient Active Problem List   Diagnosis Date Noted  . Proximal humerus fracture 12/10/2015  . Right foot pain 09/26/2010  . Pes planus 09/26/2010  . Metatarsalgia 09/26/2010    Past Surgical History:  Procedure Laterality Date  . Jaw wired     12/09/15- 25 years ago  . ORIF SHOULDER FRACTURE Left 12/10/2015   Procedure: OPEN REDUCTION INTERNAL FIXATION (ORIF) LEFT  SHOULDER FRACTURE;  Surgeon: Netta Cedars, MD;  Location: [facility];  Service: Orthopedics;  Laterality: Left;  . Orthodontia     with jaw fracture     OB History   None      Home Medications    Prior to Admission medications   Medication Sig Start Date End Date Taking? Authorizing Provider  acetaminophen (TYLENOL) 500 MG  tablet Take 1,000 mg by mouth every 6 (six) hours as needed for moderate pain or headache.     [provider]  ARMOUR THYROID 15 MG tablet Take 15 mg by mouth once daily 09/09/15   [provider]  b complex vitamins tablet Take 1 tablet by mouth daily.    [provider]  Azucena Freed Serrata (BOSWELLIA PO) Take 1 capsule by mouth 2 (two) times daily.    [provider]  cholecalciferol (VITAMIN D) 1000 units tablet Take 1,000 Units by mouth daily.    [provider]  diclofenac sodium (VOLTAREN) 1 % GEL Apply topically 4 (four) times daily.    [provider]  DIGESTIVE ENZYMES PO Take 1 capsule by mouth daily.    [provider]  EVENING PRIMROSE OIL PO Take 1 capsule by mouth 2 (two) times daily.    [provider]  Magnesium 300 MG CAPS Take 300 mg by mouth daily.     [provider]  methocarbamol (ROBAXIN) 500 MG tablet Take 1 tablet (500 mg total) by mouth 3 (three) times daily as needed. 12/10/15   Netta Cedars, MD  Misc Natural Products (TART CHERRY ADVANCED PO) Take 1 capsule by mouth 3 (three) times daily.    [provider]  Omega-3 Fatty Acids (FISH OIL PO) Take 1 capsule by  mouth 3 (three) times daily.    [provider]  oxyCODONE (ROXICODONE) 5 MG immediate release tablet Take 1 tablet (5 mg total) by mouth every 4 (four) hours as needed for severe pain. 08/04/17   Deno Etienne, DO  oxyCODONE-acetaminophen (ROXICET) 5-325 MG tablet Take 1-2 tablets by mouth every 4 (four) hours as needed for severe pain. Patient not taking: Reported on 08/30/2016 12/10/15   Netta Cedars, MD  SELENIUM PO Take 1 tablet by mouth daily.    [provider]  sodium chloride (OCEAN) 0.65 % SOLN nasal spray Place 2-3 sprays into both nostrils as needed for congestion.    [provider]  Thiamine HCl (VITAMIN B-1 PO) Take 1 tablet by mouth daily.    [provider]    Family  History Family History  Problem Relation Age of Onset  . Hypertension Mother   . Heart attack Mother   . Diabetes Neg Hx     Social History Social History   Tobacco Use  . Smoking status: Never Smoker  . Smokeless tobacco: Never Used  Substance Use Topics  . Alcohol use: Yes    Alcohol/week: 1.2 oz    Types: 2 Glasses of wine per week  . Drug use: No     Allergies   Astemizole; Bactrim [sulfamethoxazole-trimethoprim]; Monosodium glutamate; Cortisone; and Seldane [terfenadine]   Review of Systems Review of Systems  Constitutional: Negative for chills and fever.  HENT: Negative for congestion and rhinorrhea.   Eyes: Negative for redness and visual disturbance.  Respiratory: Negative for shortness of breath and wheezing.   Cardiovascular: Negative for chest pain and palpitations.  Gastrointestinal: Negative for nausea and vomiting.  Genitourinary: Negative for dysuria and urgency.  Musculoskeletal: Positive for arthralgias and myalgias.  Skin: Negative for pallor and wound.  Neurological: Negative for dizziness and headaches.     Physical Exam Updated Vital Signs BP (!) 163/87 (BP Location: Right Arm)   Pulse 67   Temp 98.1 F (36.7 C) (Oral)   Resp 18   SpO2 100%   Physical Exam  Constitutional: She is oriented to person, place, and time. She appears well-developed and well-nourished. No distress.  HENT:  Head: Normocephalic and atraumatic.  Eyes: Pupils are equal, round, and reactive to light. EOM are normal.  Neck: Normal range of motion. Neck supple.  Cardiovascular: Normal rate and regular rhythm. Exam reveals no gallop and no friction rub.  No murmur heard. Pulmonary/Chest: Effort normal. She has no wheezes. She has no rales.  Abdominal: Soft. She exhibits no distension. There is no tenderness.  Musculoskeletal: She exhibits edema and tenderness.  Pain and tenderness to the proximal humerus.  No pain along the clavicle or the scapula.  Pulse motor and  sensation is intact distally.  Neurological: She is alert and oriented to person, place, and time.  Skin: Skin is warm and dry. She is not diaphoretic.  Psychiatric: She has a normal mood and affect. Her behavior is normal.  Nursing note and vitals reviewed.    ED Treatments / Results  Labs (all labs ordered are listed, but only abnormal results are displayed) Labs Reviewed - No data to display  EKG None  Radiology Dg Shoulder Right  Result Date: 08/04/2017 CLINICAL DATA:  [place] tonight with right shoulder pain. EXAM: RIGHT SHOULDER - 2+ VIEW COMPARISON:  None. FINDINGS: Impacted and slightly angulated humeral neck fracture. No evidence of rib fracture. Humeral head remains normally located. IMPRESSION: Impacted and slightly angulated humeral neck fracture.  Electronically Signed   By: Nelson Chimes M.D.   On: 08/04/2017 20:43   Dg Humerus Right  Result Date: 08/04/2017 CLINICAL DATA:  [place] tonight.  Pain and limited range of motion. EXAM: RIGHT HUMERUS - 2+ VIEW COMPARISON:  Shoulder same day FINDINGS: Redemonstration of an impacted and angulated humeral neck fracture. Humeral head remains located relative to the glenoid. No other regional fracture seen. The more distal humerus is negative. IMPRESSION: Impacted and angulated humeral neck fracture. Electronically Signed   By: Nelson Chimes M.D.   On: 08/04/2017 20:44    Procedures Procedures (including critical care time)  Medications Ordered in ED Medications  acetaminophen (TYLENOL) tablet 1,000 mg (1,000 mg Oral Given 08/04/17 1954)  oxyCODONE (Oxy IR/ROXICODONE) immediate release tablet 2.5 mg (2.5 mg Oral Given 08/04/17 1954)     Initial Impression / Assessment and Plan / ED Course  I have reviewed the triage vital signs and the nursing notes.  Pertinent labs & imaging results that were available during my care of the patient were reviewed by me and considered in my medical decision making (see chart for details).     72 yo  F with a chief complaint of right arm pain.  Clinically it looks like a proximal humerus fracture.  Will obtain plain films.  Found to have a right humeral neck fracture.  Placed in sling.  Orthopedic follow-up.  10:58 PM:  I have discussed the diagnosis/risks/treatment options with the patient and family and believe the pt to be eligible for discharge home to follow-up with Ortho. We also discussed returning to the ED immediately if new or worsening sx occur. We discussed the sx which are most concerning (e.g., sudden worsening pain, fever, inability to tolerate by mouth) that necessitate immediate return. Medications administered to the patient during their visit and any new prescriptions provided to the patient are listed below.  Medications given during this visit Medications  acetaminophen (TYLENOL) tablet 1,000 mg (1,000 mg Oral Given 08/04/17 1954)  oxyCODONE (Oxy IR/ROXICODONE) immediate release tablet 2.5 mg (2.5 mg Oral Given 08/04/17 1954)     Images reviewed xray with humeral neck fx  Old records reviewed prior humeral neck fx on the L, repaired by Dr. Veverly Fells.  The patient appears reasonably screen and/or stabilized for discharge and I doubt any other medical condition or other [facility] requiring further screening, evaluation, or treatment in the ED at this time prior to discharge.    Final Clinical Impressions(s) / ED Diagnoses   Final diagnoses:  Fx humeral neck, right, closed, initial encounter    ED Discharge Orders        Ordered    oxyCODONE (ROXICODONE) 5 MG immediate release tablet  Every 4 hours PRN     08/04/17 2043       Deno Etienne, DO 08/04/17 2258

## 2017-08-04 NOTE — ED Triage Notes (Signed)
Presents post fall while out in her yard, she reports that she tripped over some branches and landed on her right arm/shoulder. Right shoulder and arm pain. Pt took a 1/4 of an oxycodone before arriving.

## 2017-08-04 NOTE — Discharge Instructions (Signed)
Take tylenol 1000mg(2 extra strength) four times a day.  ° °

## 2017-08-09 ENCOUNTER — Ambulatory Visit
Admission: RE | Admit: 2017-08-09 | Discharge: 2017-08-09 | Disposition: A | Discharge status: Home / Self Care | Payer: Medicare Other | Source: Ambulatory Visit | Attending: Orthopedic Surgery | Admitting: Orthopedic Surgery

## 2017-08-09 ENCOUNTER — Other Ambulatory Visit: Payer: Self-pay | Admitting: Orthopedic Surgery

## 2017-08-09 DIAGNOSIS — T148XXA Other injury of unspecified body region, initial encounter: Secondary | ICD-10-CM

## 2017-08-09 DIAGNOSIS — S42291A Other displaced fracture of upper end of right humerus, initial encounter for closed fracture: Secondary | ICD-10-CM | POA: Diagnosis not present

## 2017-08-09 DIAGNOSIS — S42202A Unspecified fracture of upper end of left humerus, initial encounter for closed fracture: Secondary | ICD-10-CM | POA: Diagnosis not present

## 2017-08-09 NOTE — H&P (Signed)
[name] Ruiz is an 72 y.o. female.    Chief Complaint: right shoulder pain  HPI: Pt is a 72 y.o. female complaining of right shoulder pain since a fall last week. Pain had continually increased since the beginning. X-rays in the clinic show displaced right proximal humerus fracture.  Various options are discussed with the patient. Risks, benefits and expectations were discussed with the patient. Patient understand the risks, benefits and expectations and wishes to proceed with surgery.   PCP:  Jonathon Jordan, MD  D/C Plans: Home  PMH: Past Medical History:  Diagnosis Date  . Arthritis   . Constipation due to opioid therapy   . Hypothyroidism   . Scoliosis     PSH: Past Surgical History:  Procedure Laterality Date  . Jaw wired     12/09/15- 25 years ago  . ORIF SHOULDER FRACTURE Left 12/10/2015   Procedure: OPEN REDUCTION INTERNAL FIXATION (ORIF) LEFT  SHOULDER FRACTURE;  Surgeon: Netta Cedars, MD;  Location: [facility];  Service: Orthopedics;  Laterality: Left;  . Orthodontia     with jaw fracture    Social History:  reports that she has never smoked. She has never used smokeless tobacco. She reports that she drinks about 1.2 oz of alcohol per week. She reports that she does not use drugs.  Allergies:  Allergies  Allergen Reactions  . Astemizole Hives and Swelling    Cant breath. throat swells  . Bactrim [Sulfamethoxazole-Trimethoprim] Anaphylaxis, Hives and Swelling  . Monosodium Glutamate Anaphylaxis, Hives and Swelling  . Cortisone Other (See Comments)    Unknown  . Seldane [Terfenadine] Other (See Comments)    Unknown    Medications: No current facility-administered medications for this encounter.    Current Outpatient Medications  Medication Sig Dispense Refill  . acetaminophen (TYLENOL) 500 MG tablet Take 1,000 mg by mouth every 6 (six) hours as needed for moderate pain or headache.     Francia Greaves THYROID 15 MG tablet Take 15 mg by mouth once daily  1  . b  complex vitamins tablet Take 1 tablet by mouth daily.    Azucena Freed Serrata (BOSWELLIA PO) Take 1 capsule by mouth 2 (two) times daily.    . cholecalciferol (VITAMIN D) 1000 units tablet Take 1,000 Units by mouth daily.    . diclofenac sodium (VOLTAREN) 1 % GEL Apply topically 4 (four) times daily.    Marland Kitchen DIGESTIVE ENZYMES PO Take 1 capsule by mouth daily.    Marland Kitchen EVENING PRIMROSE OIL PO Take 1 capsule by mouth 2 (two) times daily.    . Magnesium 300 MG CAPS Take 300 mg by mouth daily.     . methocarbamol (ROBAXIN) 500 MG tablet Take 1 tablet (500 mg total) by mouth 3 (three) times daily as needed. 60 tablet 1  . Misc Natural Products (TART CHERRY ADVANCED PO) Take 1 capsule by mouth 3 (three) times daily.    . Omega-3 Fatty Acids (FISH OIL PO) Take 1 capsule by mouth 3 (three) times daily.    Marland Kitchen oxyCODONE (ROXICODONE) 5 MG immediate release tablet Take 1 tablet (5 mg total) by mouth every 4 (four) hours as needed for severe pain. 7 tablet 0  . oxyCODONE-acetaminophen (ROXICET) 5-325 MG tablet Take 1-2 tablets by mouth every 4 (four) hours as needed for severe pain. (Patient not taking: Reported on 08/30/2016) 60 tablet 0  . SELENIUM PO Take 1 tablet by mouth daily.    . sodium chloride (OCEAN) 0.65 % SOLN nasal spray  Place 2-3 sprays into both nostrils as needed for congestion.    . Thiamine HCl (VITAMIN B-1 PO) Take 1 tablet by mouth daily.      No results found for this or any previous visit (from the past 48 hour(s)). Ct Shoulder Right Wo Contrast  Result Date: 08/09/2017 CLINICAL DATA:  Evaluate right shoulder fracture. Fell on Thursday 08/04/2017. Preop for surgery. EXAM: CT OF THE UPPER RIGHT EXTREMITY WITHOUT CONTRAST TECHNIQUE: Multidetector CT imaging of the upper right extremity was performed according to the standard protocol. COMPARISON:  Radiographs 08/04/2017 FINDINGS: Complex comminuted and displaced humeral head and neck fractures. There is significant medial and posterior rotation of  the humeral head likely due to the pull of the rotator cuff tendons. Nondisplaced fracture through the greater tuberosity. The lesser tuberosity is intact. The glenoid is intact. No glenoid fracture. No significant glenohumeral joint degenerative changes. Moderate glenohumeral hemarthrosis. The [facility] joint is intact.  Type 2 acromion.  No lateral downsloping. The visualized ribs are intact and the visualized right lung is grossly clear. IMPRESSION: 1. Comminuted and displaced humeral head and neck fractures as detailed above. 2. No fractures of the scapula, clavicle or ribs. 3. Grossly intact rotator cuff tendons. Electronically Signed   By: Marijo Sanes M.D.   On: 08/09/2017 12:33    ROS: Pain with rom of the right upper extremity  Physical Exam:  Alert and oriented 72 y.o. female in no acute distress Cranial nerves 2-12 intact Cervical spine: full rom with no tenderness, nv intact distally Chest: active breath sounds bilaterally, no wheeze rhonchi or rales Heart: regular rate and rhythm, no murmur Abd: non tender non distended with active bowel sounds Hip is stable with rom  Right shoulder with mild edema and ecchymosis Very limited rom due to known fracture nv intact distally No signs of open injury  Assessment/Plan Assessment: right proximal humerus fracture displaced  Plan: Patient will undergo a right humerus ORIF by Dr. Veverly Fells at [facility]. Risks benefits and expectations were discussed with the patient. Patient understand risks, benefits and expectations and wishes to proceed.  Merla Riches PA-C, MPAS [facility] is now [employer] [address], [place], [place], [place] [address] Phone: [phone] [email or web address] Facebook  [employer]

## 2017-08-11 ENCOUNTER — Other Ambulatory Visit: Payer: Self-pay

## 2017-08-11 ENCOUNTER — Encounter (HOSPITAL_COMMUNITY): Payer: Self-pay | Admitting: *Deleted

## 2017-08-11 NOTE — Progress Notes (Signed)
Pt denies SOB, chest pain, and being under the care of a cardiologist. Pt denies having a stress test, echo and cardiac cath. Pt denies having an EKG and chest x ray within the last year. Pt denies recent labs. Pt made aware to stop taking vitamins, fish oil, Primrose oil, Digestive Enzymes, Tart Cherry, Boswellia, Selinium, Thiamine, and herbal medications. Do not take any NSAIDs ie: Ibuprofen, Advil, Naproxen (Aleve), Motrin, BC and Goody Powder and Voltaren Gel. Pt verbalized understanding of all pre-op instructions.Marland Kitchen

## 2017-08-15 ENCOUNTER — Ambulatory Visit (HOSPITAL_COMMUNITY): Payer: Medicare Other | Admitting: Certified Registered Nurse Anesthetist

## 2017-08-15 ENCOUNTER — Observation Stay (HOSPITAL_COMMUNITY)
Admission: RE | Admit: 2017-08-15 | Discharge: 2017-08-16 | Disposition: A | Discharge status: Home / Self Care | Payer: Medicare Other | Source: Ambulatory Visit | Attending: Orthopedic Surgery | Admitting: Orthopedic Surgery

## 2017-08-15 ENCOUNTER — Other Ambulatory Visit: Payer: Self-pay

## 2017-08-15 ENCOUNTER — Encounter (HOSPITAL_COMMUNITY)
Admission: RE | Disposition: A | Discharge status: Home / Self Care | Payer: Self-pay | Source: Ambulatory Visit | Attending: Orthopedic Surgery

## 2017-08-15 ENCOUNTER — Encounter (HOSPITAL_COMMUNITY): Payer: Self-pay | Admitting: *Deleted

## 2017-08-15 DIAGNOSIS — S4291XA Fracture of right shoulder girdle, part unspecified, initial encounter for closed fracture: Secondary | ICD-10-CM | POA: Diagnosis present

## 2017-08-15 DIAGNOSIS — M419 Scoliosis, unspecified: Secondary | ICD-10-CM | POA: Diagnosis not present

## 2017-08-15 DIAGNOSIS — Z79890 Hormone replacement therapy (postmenopausal): Secondary | ICD-10-CM | POA: Insufficient documentation

## 2017-08-15 DIAGNOSIS — G8918 Other acute postprocedural pain: Secondary | ICD-10-CM | POA: Diagnosis not present

## 2017-08-15 DIAGNOSIS — S42291A Other displaced fracture of upper end of right humerus, initial encounter for closed fracture: Secondary | ICD-10-CM | POA: Diagnosis not present

## 2017-08-15 DIAGNOSIS — E039 Hypothyroidism, unspecified: Secondary | ICD-10-CM | POA: Diagnosis not present

## 2017-08-15 DIAGNOSIS — W19XXXA Unspecified fall, initial encounter: Secondary | ICD-10-CM | POA: Insufficient documentation

## 2017-08-15 DIAGNOSIS — Z79891 Long term (current) use of opiate analgesic: Secondary | ICD-10-CM | POA: Diagnosis not present

## 2017-08-15 DIAGNOSIS — Z79899 Other long term (current) drug therapy: Secondary | ICD-10-CM | POA: Insufficient documentation

## 2017-08-15 DIAGNOSIS — Z888 Allergy status to other drugs, medicaments and biological substances status: Secondary | ICD-10-CM | POA: Insufficient documentation

## 2017-08-15 DIAGNOSIS — S42201A Unspecified fracture of upper end of right humerus, initial encounter for closed fracture: Secondary | ICD-10-CM | POA: Diagnosis not present

## 2017-08-15 DIAGNOSIS — R2681 Unsteadiness on feet: Secondary | ICD-10-CM | POA: Insufficient documentation

## 2017-08-15 DIAGNOSIS — Z882 Allergy status to sulfonamides status: Secondary | ICD-10-CM | POA: Diagnosis not present

## 2017-08-15 HISTORY — DX: Anemia, unspecified: D64.9

## 2017-08-15 HISTORY — PX: ORIF HUMERUS FRACTURE: SHX2126

## 2017-08-15 HISTORY — DX: Unspecified fracture of upper end of unspecified humerus, initial encounter for closed fracture: S42.209A

## 2017-08-15 LAB — CBC
HCT: 39.3 % (ref 36.0–46.0)
Hemoglobin: 13.2 g/dL (ref 12.0–15.0)
MCH: 33.2 pg (ref 26.0–34.0)
MCHC: 33.6 g/dL (ref 30.0–36.0)
MCV: 99 fL (ref 78.0–100.0)
PLATELETS: 318 10*3/uL (ref 150–400)
RBC: 3.97 MIL/uL (ref 3.87–5.11)
RDW: 12.7 % (ref 11.5–15.5)
WBC: 4.1 10*3/uL (ref 4.0–10.5)

## 2017-08-15 SURGERY — OPEN REDUCTION INTERNAL FIXATION (ORIF) PROXIMAL HUMERUS FRACTURE
Anesthesia: Regional | Site: Shoulder | Laterality: Right

## 2017-08-15 MED ORDER — PROPOFOL 10 MG/ML IV BOLUS
INTRAVENOUS | Status: AC
  Filled 2017-08-15: qty 20

## 2017-08-15 MED ORDER — ACETAMINOPHEN 325 MG PO TABS: 325.0000 mg | ORAL_TABLET | Freq: Four times a day (QID) | ORAL | Status: DC | PRN

## 2017-08-15 MED ORDER — SUGAMMADEX SODIUM 200 MG/2ML IV SOLN
INTRAVENOUS | Status: DC | PRN
  Administered 2017-08-15: 100 mg via INTRAVENOUS

## 2017-08-15 MED ORDER — CEFAZOLIN SODIUM-DEXTROSE 2-4 GM/100ML-% IV SOLN
INTRAVENOUS | Status: AC
  Filled 2017-08-15: qty 100

## 2017-08-15 MED ORDER — ROCURONIUM BROMIDE 10 MG/ML (PF) SYRINGE
PREFILLED_SYRINGE | INTRAVENOUS | Status: DC | PRN
  Administered 2017-08-15: 40 mg via INTRAVENOUS

## 2017-08-15 MED ORDER — CEFAZOLIN SODIUM-DEXTROSE 2-4 GM/100ML-% IV SOLN
2.0000 g | Freq: Four times a day (QID) | INTRAVENOUS | Status: DC
  Administered 2017-08-15 – 2017-08-16 (×2): 2 g via INTRAVENOUS
  Filled 2017-08-15 (×3): qty 100

## 2017-08-15 MED ORDER — BUPIVACAINE-EPINEPHRINE (PF) 0.25% -1:200000 IJ SOLN
INTRAMUSCULAR | Status: AC
  Filled 2017-08-15: qty 30

## 2017-08-15 MED ORDER — LIDOCAINE 2% (20 MG/ML) 5 ML SYRINGE
INTRAMUSCULAR | Status: AC
  Filled 2017-08-15: qty 10

## 2017-08-15 MED ORDER — SUGAMMADEX SODIUM 200 MG/2ML IV SOLN
INTRAVENOUS | Status: AC
  Filled 2017-08-15: qty 2

## 2017-08-15 MED ORDER — THYROID 30 MG PO TABS
15.0000 mg | ORAL_TABLET | Freq: Every day | ORAL | Status: DC
  Filled 2017-08-15: qty 1

## 2017-08-15 MED ORDER — B COMPLEX-C PO TABS
1.0000 | ORAL_TABLET | Freq: Every day | ORAL | Status: DC
  Filled 2017-08-15: qty 1

## 2017-08-15 MED ORDER — PHENYLEPHRINE HCL 10 MG/ML IJ SOLN
INTRAVENOUS | Status: DC | PRN
  Administered 2017-08-15: 15 ug/min via INTRAVENOUS

## 2017-08-15 MED ORDER — ROCURONIUM BROMIDE 50 MG/5ML IV SOLN
INTRAVENOUS | Status: AC
  Filled 2017-08-15: qty 1

## 2017-08-15 MED ORDER — LIDOCAINE 2% (20 MG/ML) 5 ML SYRINGE
INTRAMUSCULAR | Status: DC | PRN
  Administered 2017-08-15: 40 mg via INTRAVENOUS

## 2017-08-15 MED ORDER — CHLORHEXIDINE GLUCONATE 4 % EX LIQD: 60.0000 mL | Freq: Once | CUTANEOUS | Status: DC

## 2017-08-15 MED ORDER — ROPIVACAINE HCL 7.5 MG/ML IJ SOLN
INTRAMUSCULAR | Status: DC | PRN
  Administered 2017-08-15: 20 mL via PERINEURAL

## 2017-08-15 MED ORDER — METHOCARBAMOL 1000 MG/10ML IJ SOLN
500.0000 mg | Freq: Four times a day (QID) | INTRAVENOUS | Status: DC | PRN
  Filled 2017-08-15: qty 5

## 2017-08-15 MED ORDER — 0.9 % SODIUM CHLORIDE (POUR BTL) OPTIME
TOPICAL | Status: DC | PRN
  Administered 2017-08-15: 1000 mL

## 2017-08-15 MED ORDER — KETOROLAC TROMETHAMINE 30 MG/ML IJ SOLN
INTRAMUSCULAR | Status: AC
  Filled 2017-08-15: qty 1

## 2017-08-15 MED ORDER — METOCLOPRAMIDE HCL 5 MG/ML IJ SOLN: 5.0000 mg | Freq: Three times a day (TID) | INTRAMUSCULAR | Status: DC | PRN

## 2017-08-15 MED ORDER — MENTHOL 3 MG MT LOZG
1.0000 | LOZENGE | OROMUCOSAL | Status: DC | PRN
  Filled 2017-08-15: qty 9

## 2017-08-15 MED ORDER — CEFAZOLIN SODIUM-DEXTROSE 2-4 GM/100ML-% IV SOLN
2.0000 g | INTRAVENOUS | Status: AC
  Administered 2017-08-15: 2 g via INTRAVENOUS

## 2017-08-15 MED ORDER — LACTATED RINGERS IV SOLN
INTRAVENOUS | Status: DC
Start: 2017-08-15 — End: 2017-08-15
  Administered 2017-08-15 (×2): via INTRAVENOUS

## 2017-08-15 MED ORDER — FENTANYL CITRATE (PF) 100 MCG/2ML IJ SOLN: 25.0000 ug | INTRAMUSCULAR | Status: DC | PRN

## 2017-08-15 MED ORDER — ACETAMINOPHEN 500 MG PO TABS: 500.0000 mg | ORAL_TABLET | Freq: Four times a day (QID) | ORAL | Status: DC | PRN

## 2017-08-15 MED ORDER — OXYCODONE HCL 5 MG PO TABS
10.0000 mg | ORAL_TABLET | ORAL | Status: DC | PRN
  Filled 2017-08-15: qty 2

## 2017-08-15 MED ORDER — SODIUM CHLORIDE 0.9 % IV SOLN
INTRAVENOUS | Status: DC
Start: 2017-08-15 — End: 2017-08-16

## 2017-08-15 MED ORDER — BUPIVACAINE-EPINEPHRINE (PF) 0.25% -1:200000 IJ SOLN
INTRAMUSCULAR | Status: DC | PRN
  Administered 2017-08-15: 10 mL

## 2017-08-15 MED ORDER — OXYCODONE-ACETAMINOPHEN 5-325 MG PO TABS
1.0000 | ORAL_TABLET | ORAL | 0 refills | Status: AC | PRN
Start: 2017-08-15 — End: 2018-08-15

## 2017-08-15 MED ORDER — PHENOL 1.4 % MT LIQD: 1.0000 | OROMUCOSAL | Status: DC | PRN

## 2017-08-15 MED ORDER — ONDANSETRON HCL 4 MG/2ML IJ SOLN
INTRAMUSCULAR | Status: DC | PRN
  Administered 2017-08-15: 4 mg via INTRAVENOUS

## 2017-08-15 MED ORDER — METOCLOPRAMIDE HCL 5 MG PO TABS: 5.0000 mg | ORAL_TABLET | Freq: Three times a day (TID) | ORAL | Status: DC | PRN

## 2017-08-15 MED ORDER — OXYCODONE HCL 5 MG PO TABS
5.0000 mg | ORAL_TABLET | ORAL | Status: DC | PRN
  Administered 2017-08-16 (×3): 10 mg via ORAL
  Filled 2017-08-15 (×2): qty 2

## 2017-08-15 MED ORDER — SALINE SPRAY 0.65 % NA SOLN
2.0000 | NASAL | Status: DC | PRN
  Filled 2017-08-15: qty 44

## 2017-08-15 MED ORDER — ARTIFICIAL TEARS OPHTHALMIC OINT
TOPICAL_OINTMENT | OPHTHALMIC | Status: AC
  Filled 2017-08-15: qty 3.5

## 2017-08-15 MED ORDER — POLYETHYLENE GLYCOL 3350 17 G PO PACK: 17.0000 g | PACK | Freq: Every day | ORAL | Status: DC | PRN

## 2017-08-15 MED ORDER — MAGNESIUM 500 MG PO TABS: 500.0000 mg | ORAL_TABLET | Freq: Two times a day (BID) | ORAL | Status: DC

## 2017-08-15 MED ORDER — KETOROLAC TROMETHAMINE 30 MG/ML IJ SOLN
INTRAMUSCULAR | Status: DC | PRN
  Administered 2017-08-15: 15 mg via INTRAVENOUS

## 2017-08-15 MED ORDER — FENTANYL CITRATE (PF) 250 MCG/5ML IJ SOLN
INTRAMUSCULAR | Status: AC
  Filled 2017-08-15: qty 5

## 2017-08-15 MED ORDER — FENTANYL CITRATE (PF) 100 MCG/2ML IJ SOLN
INTRAMUSCULAR | Status: AC
  Administered 2017-08-15: 100 ug
  Filled 2017-08-15: qty 2

## 2017-08-15 MED ORDER — VITAMIN D 1000 UNITS PO TABS
2000.0000 [IU] | ORAL_TABLET | Freq: Every day | ORAL | Status: DC
  Filled 2017-08-15: qty 2

## 2017-08-15 MED ORDER — ASPIRIN 325 MG PO TABS
325.0000 mg | ORAL_TABLET | Freq: Every day | ORAL | Status: DC
  Administered 2017-08-16: 325 mg via ORAL
  Filled 2017-08-15: qty 1

## 2017-08-15 MED ORDER — SELENIUM 200 MCG PO CAPS: 200.0000 ug | ORAL_CAPSULE | Freq: Every day | ORAL | Status: DC

## 2017-08-15 MED ORDER — DEXAMETHASONE SODIUM PHOSPHATE 10 MG/ML IJ SOLN
INTRAMUSCULAR | Status: DC | PRN
  Administered 2017-08-15: 10 mg via INTRAVENOUS

## 2017-08-15 MED ORDER — METHOCARBAMOL 500 MG PO TABS: 500.0000 mg | ORAL_TABLET | Freq: Three times a day (TID) | ORAL | 1 refills | Status: AC | PRN

## 2017-08-15 MED ORDER — SUCCINYLCHOLINE CHLORIDE 200 MG/10ML IV SOSY
PREFILLED_SYRINGE | INTRAVENOUS | Status: AC
  Filled 2017-08-15: qty 10

## 2017-08-15 MED ORDER — DEXAMETHASONE SODIUM PHOSPHATE 10 MG/ML IJ SOLN
INTRAMUSCULAR | Status: AC
  Filled 2017-08-15: qty 1

## 2017-08-15 MED ORDER — BISACODYL 10 MG RE SUPP: 10.0000 mg | Freq: Every day | RECTAL | Status: DC | PRN

## 2017-08-15 MED ORDER — PROPOFOL 10 MG/ML IV BOLUS
INTRAVENOUS | Status: DC | PRN
  Administered 2017-08-15: 90 mg via INTRAVENOUS

## 2017-08-15 MED ORDER — ONDANSETRON HCL 4 MG/2ML IJ SOLN: 4.0000 mg | Freq: Once | INTRAMUSCULAR | Status: DC | PRN

## 2017-08-15 MED ORDER — TRAMADOL HCL 50 MG PO TABS
50.0000 mg | ORAL_TABLET | Freq: Four times a day (QID) | ORAL | Status: DC
  Administered 2017-08-15 – 2017-08-16 (×3): 50 mg via ORAL
  Filled 2017-08-15 (×3): qty 1

## 2017-08-15 MED ORDER — DOCUSATE SODIUM 100 MG PO CAPS
100.0000 mg | ORAL_CAPSULE | Freq: Two times a day (BID) | ORAL | Status: DC
  Filled 2017-08-15 (×2): qty 1

## 2017-08-15 MED ORDER — ONDANSETRON HCL 4 MG/2ML IJ SOLN: 4.0000 mg | Freq: Four times a day (QID) | INTRAMUSCULAR | Status: DC | PRN

## 2017-08-15 MED ORDER — ONDANSETRON HCL 4 MG PO TABS: 4.0000 mg | ORAL_TABLET | Freq: Four times a day (QID) | ORAL | Status: DC | PRN

## 2017-08-15 MED ORDER — GLYCOPYRROLATE 0.2 MG/ML IV SOSY
PREFILLED_SYRINGE | INTRAVENOUS | Status: DC | PRN
  Administered 2017-08-15: .2 mg via INTRAVENOUS

## 2017-08-15 MED ORDER — MIDAZOLAM HCL 2 MG/2ML IJ SOLN
INTRAMUSCULAR | Status: AC
  Filled 2017-08-15: qty 2

## 2017-08-15 MED ORDER — METHOCARBAMOL 500 MG PO TABS
500.0000 mg | ORAL_TABLET | Freq: Four times a day (QID) | ORAL | Status: DC | PRN
  Administered 2017-08-15 – 2017-08-16 (×2): 500 mg via ORAL
  Filled 2017-08-15 (×2): qty 1

## 2017-08-15 MED ORDER — HYDROMORPHONE HCL 1 MG/ML IJ SOLN: 0.5000 mg | INTRAMUSCULAR | Status: DC | PRN

## 2017-08-15 SURGICAL SUPPLY — 69 items
BIT DRILL 3.2 (BIT) ×4
BIT DRILL 3.2XCALB NS DISP (BIT) ×2 IMPLANT
BIT DRILL CALIBRATED 2.7 (BIT) ×2 IMPLANT
BIT DRILL CALIBRATED 2.7MM (BIT) ×1
BIT DRL 3.2XCALB NS DISP (BIT) ×2
CLOSURE STERI-STRIP 1/2X4 (GAUZE/BANDAGES/DRESSINGS) ×2
CLOSURE WOUND 1/2 X4 (GAUZE/BANDAGES/DRESSINGS) ×2
CLSR STERI-STRIP ANTIMIC 1/2X4 (GAUZE/BANDAGES/DRESSINGS) ×4 IMPLANT
COVER SURGICAL LIGHT HANDLE (MISCELLANEOUS) ×3 IMPLANT
DRAPE IMP U-DRAPE 54X76 (DRAPES) ×3 IMPLANT
DRAPE INCISE IOBAN 66X45 STRL (DRAPES) IMPLANT
DRAPE OEC MINIVIEW 54X84 (DRAPES) ×3 IMPLANT
DRAPE ORTHO SPLIT 77X108 STRL (DRAPES) ×4
DRAPE SURG ORHT 6 SPLT 77X108 (DRAPES) ×2 IMPLANT
DRAPE U-SHAPE 47X51 STRL (DRAPES) ×3 IMPLANT
DRSG EMULSION OIL 3X3 NADH (GAUZE/BANDAGES/DRESSINGS) IMPLANT
DRSG PAD ABDOMINAL 8X10 ST (GAUZE/BANDAGES/DRESSINGS) ×3 IMPLANT
DURAPREP 26ML APPLICATOR (WOUND CARE) ×3 IMPLANT
ELECT NEEDLE BLADE 2-5/6 (NEEDLE) ×3 IMPLANT
ELECT REM PT RETURN 9FT ADLT (ELECTROSURGICAL) ×3
ELECTRODE REM PT RTRN 9FT ADLT (ELECTROSURGICAL) ×1 IMPLANT
GAUZE SPONGE 4X4 12PLY STRL (GAUZE/BANDAGES/DRESSINGS) ×3 IMPLANT
GLOVE BIOGEL PI ORTHO PRO 7.5 (GLOVE) ×2
GLOVE BIOGEL PI ORTHO PRO SZ8 (GLOVE) ×2
GLOVE ORTHO TXT STRL SZ7.5 (GLOVE) ×3 IMPLANT
GLOVE PI ORTHO PRO STRL 7.5 (GLOVE) ×1 IMPLANT
GLOVE PI ORTHO PRO STRL SZ8 (GLOVE) ×1 IMPLANT
GLOVE SURG ORTHO 8.5 STRL (GLOVE) ×3 IMPLANT
GOWN STRL REUS W/ TWL XL LVL3 (GOWN DISPOSABLE) ×2 IMPLANT
GOWN STRL REUS W/TWL XL LVL3 (GOWN DISPOSABLE) ×4
K-WIRE 2X5 SS THRDED S3 (WIRE) ×3
KIT BASIN OR (CUSTOM PROCEDURE TRAY) ×3 IMPLANT
KIT TURNOVER KIT B (KITS) ×3 IMPLANT
KWIRE 2X5 SS THRDED S3 (WIRE) ×1 IMPLANT
MANIFOLD NEPTUNE II (INSTRUMENTS) ×3 IMPLANT
NEEDLE HYPO 25GX1X1/2 BEV (NEEDLE) IMPLANT
NS IRRIG 1000ML POUR BTL (IV SOLUTION) ×3 IMPLANT
PACK SHOULDER (CUSTOM PROCEDURE TRAY) ×3 IMPLANT
PAD ABD 8X10 STRL (GAUZE/BANDAGES/DRESSINGS) ×3 IMPLANT
PAD ARMBOARD 7.5X6 YLW CONV (MISCELLANEOUS) ×6 IMPLANT
PEG LOCKING 3.2MMX56 (Peg) ×3 IMPLANT
PEG LOCKING 3.2X 28MM (Peg) ×3 IMPLANT
PEG LOCKING 3.2X32 (Peg) ×3 IMPLANT
PEG LOCKING 3.2X36 (Screw) ×3 IMPLANT
PEG LOCKING 3.2X38 (Screw) ×3 IMPLANT
PEG LOCKING 3.2X40 (Peg) ×3 IMPLANT
PEG LOCKING 3.2X50 (Screw) ×3 IMPLANT
PLATE 3HOLE HUMERUS PROX RT (Plate) ×3 IMPLANT
SCREW LOCK CORT STAR 3.5X22 (Screw) ×3 IMPLANT
SCREW LOCK CORT STAR 3.5X36 (Screw) ×3 IMPLANT
SCREW LP NL T15 3.5X22 (Screw) ×3 IMPLANT
SCREW LP NL T15 3.5X24 (Screw) ×3 IMPLANT
SCREW LP NL T15 3.5X26 (Screw) ×3 IMPLANT
SCREW T15 LP CORT 3.5X40MM NS (Screw) ×3 IMPLANT
SLING ARM FOAM STRAP LRG (SOFTGOODS) ×3 IMPLANT
SPONGE LAP 4X18 X RAY DECT (DISPOSABLE) ×6 IMPLANT
STRIP CLOSURE SKIN 1/2X4 (GAUZE/BANDAGES/DRESSINGS) ×4 IMPLANT
SUCTION FRAZIER HANDLE 10FR (MISCELLANEOUS) ×2
SUCTION TUBE FRAZIER 10FR DISP (MISCELLANEOUS) ×1 IMPLANT
SUT FIBERWIRE #2 38 T-5 BLUE (SUTURE)
SUT MNCRL AB 4-0 PS2 18 (SUTURE) ×3 IMPLANT
SUT VIC AB 0 CT1 27 (SUTURE) ×2
SUT VIC AB 0 CT1 27XBRD ANBCTR (SUTURE) ×1 IMPLANT
SUT VIC AB 2-0 CT1 27 (SUTURE) ×4
SUT VIC AB 2-0 CT1 TAPERPNT 27 (SUTURE) ×2 IMPLANT
SUTURE FIBERWR #2 38 T-5 BLUE (SUTURE) IMPLANT
SYR CONTROL 10ML LL (SYRINGE) ×3 IMPLANT
TOWEL OR 17X24 6PK STRL BLUE (TOWEL DISPOSABLE) ×3 IMPLANT
TOWEL OR 17X26 10 PK STRL BLUE (TOWEL DISPOSABLE) ×3 IMPLANT

## 2017-08-15 NOTE — Plan of Care (Signed)
  Problem: Education: Goal: Knowledge of General Education information will improve Outcome: Progressing   Problem: Health Behavior/Discharge Planning: Goal: Ability to manage health-related needs will improve Outcome: Progressing   Problem: Clinical Measurements: Goal: Ability to maintain clinical measurements within normal limits will improve Outcome: Progressing Goal: Will remain free from infection Outcome: Progressing Goal: Diagnostic test results will improve Outcome: Progressing Goal: Respiratory complications will improve Outcome: Progressing Goal: Cardiovascular complication will be avoided Outcome: Progressing   Problem: Activity: Goal: Risk for activity intolerance will decrease Outcome: Progressing   Problem: Nutrition: Goal: Adequate nutrition will be maintained Outcome: Progressing   Problem: Coping: Goal: Level of anxiety will decrease Outcome: Progressing   Problem: Elimination: Goal: Will not experience complications related to bowel motility Outcome: Progressing Goal: Will not experience complications related to urinary retention Outcome: Progressing   Problem: Pain Managment: Goal: General experience of comfort will improve Outcome: Progressing   Problem: Safety: Goal: Ability to remain free from injury will improve Outcome: Progressing   Problem: Skin Integrity: Goal: Risk for impaired skin integrity will decrease Outcome: Progressing   Problem: Education: Goal: Knowledge of the prescribed therapeutic regimen will improve Outcome: Progressing Goal: Understanding of activity limitations/precautions following surgery will improve Outcome: Progressing   Problem: Activity: Goal: Ability to tolerate increased activity will improve Outcome: Progressing   Problem: Pain Management: Goal: Pain level will decrease with appropriate interventions Outcome: Progressing   

## 2017-08-15 NOTE — Interval H&P Note (Signed)
History and Physical Interval Note:  08/15/2017 1:57 PM  [name] Ruiz  has presented today for surgery, with the diagnosis of Right proximal humerus fracture  The various methods of treatment have been discussed with the patient and family. After consideration of risks, benefits and other options for treatment, the patient has consented to  Procedure(s): OPEN REDUCTION INTERNAL FIXATION (ORIF) PROXIMAL HUMERUS FRACTURE (Right) as a surgical intervention .  The patient's history has been reviewed, patient examined, no change in status, stable for surgery.  I have reviewed the patient's chart and labs.  Questions were answered to the patient's satisfaction.     Detria Cummings,STEVEN R

## 2017-08-15 NOTE — Brief Op Note (Signed)
08/15/2017  4:13 PM  PATIENT:  [name] Ruiz  72 y.o. female  PRE-OPERATIVE DIAGNOSIS:  Right proximal humerus fracture, displaced comminuted   POST-OPERATIVE DIAGNOSIS:  Right proximal humerus fracture, displaced comminuted  PROCEDURE:  Procedure(s): OPEN REDUCTION INTERNAL FIXATION (ORIF) PROXIMAL HUMERUS FRACTURE (Right)  Biomet S3 plate  SURGEON:  Surgeon(s) and Role:    Netta Cedars, MD - Primary  PHYSICIAN ASSISTANT:   ASSISTANTS: Ventura Bruns, PA-C   ANESTHESIA:   regional and general  EBL:  75 mL   BLOOD ADMINISTERED:none  DRAINS: none   LOCAL MEDICATIONS USED:  MARCAINE     SPECIMEN:  No Specimen  DISPOSITION OF SPECIMEN:  N/A  COUNTS:  YES  TOURNIQUET:  * No tourniquets in log *  DICTATION: .Other Dictation: Dictation Number [id number]  PLAN OF CARE: Admit to inpatient   PATIENT DISPOSITION:  PACU - hemodynamically stable.   Delay start of Pharmacological VTE agent (>24hrs) due to surgical blood loss or risk of bleeding: not applicable

## 2017-08-15 NOTE — Discharge Instructions (Signed)
Ice to the shoulder constantly.  Ok to remove the sling while seated in the house for exercises and light daily activities and self care,  Do NOT push pull or lift with the right arm.  Please keep the incision clean and dry and covered for one week, then ok to get it wet in the shower.  Follow up in the office in two weeks [phone]

## 2017-08-15 NOTE — Anesthesia Procedure Notes (Signed)
Procedure Name: Intubation Date/Time: 08/15/2017 2:34 PM Performed by: Colin Benton, CRNA Pre-anesthesia Checklist: Patient identified, Emergency Drugs available, Suction available and Patient being monitored Patient Re-evaluated:Patient Re-evaluated prior to induction Oxygen Delivery Method: Circle System Utilized Preoxygenation: Pre-oxygenation with 100% oxygen Induction Type: IV induction Ventilation: Mask ventilation without difficulty Laryngoscope Size: Miller and 2 Grade View: Grade I Tube type: Oral Tube size: 7.0 mm Number of attempts: 1 Airway Equipment and Method: Stylet and Oral airway Placement Confirmation: ETT inserted through vocal cords under direct vision,  positive ETCO2 and breath sounds checked- equal and bilateral Secured at: 21 cm Tube secured with: Tape Dental Injury: Teeth and Oropharynx as per pre-operative assessment

## 2017-08-15 NOTE — Anesthesia Preprocedure Evaluation (Addendum)
Anesthesia Evaluation  Patient identified by MRN, date of birth, ID band Patient awake    Reviewed: Allergy & Precautions, NPO status , Patient's Chart, lab work & pertinent test results  Airway Mallampati: III  TM Distance: >3 FB Neck ROM: Full    Dental no notable dental hx.    Pulmonary neg pulmonary ROS,    Pulmonary exam normal breath sounds clear to auscultation       Cardiovascular negative cardio ROS Normal cardiovascular exam Rhythm:Regular Rate:Normal     Neuro/Psych negative neurological ROS  negative psych ROS   GI/Hepatic negative GI ROS, Neg liver ROS,   Endo/Other  Hypothyroidism   Renal/GU negative Renal ROS     Musculoskeletal Scoliosis Motor and sensation intact to right hand   Abdominal   Peds  Hematology negative hematology ROS (+)   Anesthesia Other Findings Right proximal humerus fracture  Reproductive/Obstetrics                            Anesthesia Physical Anesthesia Plan  ASA: II  Anesthesia Plan: General and Regional   Post-op Pain Management: GA combined w/ Regional for post-op pain   Induction: Intravenous  PONV Risk Score and Plan: 3 and Ondansetron, Dexamethasone and Treatment may vary due to age or medical condition  Airway Management Planned: Oral ETT  Additional Equipment:   Intra-op Plan:   Post-operative Plan: Extubation in OR  Informed Consent: I have reviewed the patients History and Physical, chart, labs and discussed the procedure including the risks, benefits and alternatives for the proposed anesthesia with the patient or authorized representative who has indicated his/her understanding and acceptance.   Dental advisory given  Plan Discussed with: CRNA  Anesthesia Plan Comments:         Anesthesia Quick Evaluation

## 2017-08-15 NOTE — Transfer of Care (Signed)
Immediate Anesthesia Transfer of Care Note  Patient: [name] Ruiz  Procedure(s) Performed: OPEN REDUCTION INTERNAL FIXATION (ORIF) PROXIMAL HUMERUS FRACTURE (Right Shoulder)  Patient Location: PACU  Anesthesia Type:GA combined with regional for post-op pain  Level of Consciousness: awake, alert  and oriented  Airway & Oxygen Therapy: Patient Spontanous Breathing and Patient connected to nasal cannula oxygen  Post-op Assessment: Report given to RN and Post -op Vital signs reviewed and stable  Post vital signs: Reviewed and stable  Last Vitals:  Vitals Value Taken Time  BP 166/99 08/15/2017  4:26 PM  Temp    Pulse 102 08/15/2017  4:30 PM  Resp 14 08/15/2017  4:30 PM  SpO2 99 % 08/15/2017  4:30 PM  Vitals shown include unvalidated device data.  Last Pain:  Vitals:   08/15/17 1158  TempSrc: Oral  PainSc: 7          Complications: No apparent anesthesia complications

## 2017-08-15 NOTE — Anesthesia Procedure Notes (Signed)
Anesthesia Regional Block: Interscalene brachial plexus block   Pre-Anesthetic Checklist: ,, timeout performed, Correct Patient, Correct Site, Correct Laterality, Correct Procedure,, site marked, risks and benefits discussed, Surgical consent,  Pre-op evaluation,  At surgeon's request and post-op pain management  Laterality: Right  Prep: chloraprep       Needles:  Injection technique: Single-shot  Needle Type: Echogenic Stimulator Needle     Needle Length: 9cm  Needle Gauge: 21     Additional Needles:   Procedures:,,,, ultrasound used (permanent image in chart),,,,  Narrative:  Start time: 08/15/2017 1:20 PM End time: 08/15/2017 1:30 PM Injection made incrementally with aspirations every 5 mL.  Performed by: Personally  Anesthesiologist: Murvin Natal, MD  Additional Notes: Functioning IV was confirmed and monitors were applied.  A 18mm 21ga Arrow echogenic stimulator needle was used. Sterile prep, hand hygiene and sterile gloves were used.  Negative aspiration and negative test dose prior to incremental administration of local anesthetic. The patient tolerated the procedure well.

## 2017-08-16 ENCOUNTER — Encounter (HOSPITAL_COMMUNITY): Payer: Self-pay | Admitting: Orthopedic Surgery

## 2017-08-16 DIAGNOSIS — S42201A Unspecified fracture of upper end of right humerus, initial encounter for closed fracture: Secondary | ICD-10-CM | POA: Diagnosis not present

## 2017-08-16 DIAGNOSIS — Z882 Allergy status to sulfonamides status: Secondary | ICD-10-CM | POA: Diagnosis not present

## 2017-08-16 DIAGNOSIS — M419 Scoliosis, unspecified: Secondary | ICD-10-CM | POA: Diagnosis not present

## 2017-08-16 DIAGNOSIS — Z79890 Hormone replacement therapy (postmenopausal): Secondary | ICD-10-CM | POA: Diagnosis not present

## 2017-08-16 DIAGNOSIS — Z888 Allergy status to other drugs, medicaments and biological substances status: Secondary | ICD-10-CM | POA: Diagnosis not present

## 2017-08-16 DIAGNOSIS — E039 Hypothyroidism, unspecified: Secondary | ICD-10-CM | POA: Diagnosis not present

## 2017-08-16 LAB — BASIC METABOLIC PANEL
ANION GAP: 9 (ref 5–15)
BUN: 8 mg/dL (ref 6–20)
CHLORIDE: 92 mmol/L — AB (ref 101–111)
CO2: 29 mmol/L (ref 22–32)
CREATININE: 0.93 mg/dL (ref 0.44–1.00)
Calcium: 9.2 mg/dL (ref 8.9–10.3)
GFR calc non Af Amer: >60 mL/min (ref >60)
Glucose, Bld: 106 mg/dL — ABNORMAL HIGH (ref 65–99)
Potassium: 3.5 mmol/L (ref 3.5–5.1)
Sodium: 130 mmol/L — ABNORMAL LOW (ref 135–145)

## 2017-08-16 LAB — HEMOGLOBIN AND HEMATOCRIT, BLOOD
HCT: 36.4 % (ref 36.0–46.0)
Hemoglobin: 12.1 g/dL (ref 12.0–15.0)

## 2017-08-16 NOTE — Care Management CC44 (Signed)
Condition Code 44 Documentation Completed  Patient Details  Name: [name] Ruiz MRN: [id number] Date of Birth: Dec 24, 1945   Condition Code 44 given:  Yes Patient signature on Condition Code 44 notice:  Yes Documentation of 2 MD's agreement:  Yes Code 44 added to claim:  Yes    Ninfa Meeker, RN 08/16/2017, 11:09 AM

## 2017-08-16 NOTE — Evaluation (Signed)
Occupational Therapy Evaluation Patient Details Name: [name] Ruiz MRN: [id number] DOB: 04/24/1945 Today's Date: 08/16/2017    History of Present Illness 72 yo female s/p R ORIF humerus  Past Medical History:  Diagnosis Date  . Anemia    as a child only  . Arthritis   . Constipation due to opioid therapy   . Hypothyroidism   . Proximal humerus fracture    right  . Scoliosis       Clinical Impression   Patient evaluated by Occupational Therapy with no further acute OT needs identified. All education has been completed and the patient has no further questions. See below for any follow-up Occupational Therapy or equipment needs. OT to sign off. Thank you for referral.      Follow Up Recommendations  Home health OT;Other (comment)(home aide)    Equipment Recommendations  None recommended by OT    Recommendations for Other Services       Precautions / Restrictions Precautions Precautions: Shoulder Type of Shoulder Precautions: conservative/ okay for adls Shoulder Interventions: At all times Required Braces or Orthoses: Sling Restrictions Weight Bearing Restrictions: Yes RUE Weight Bearing: Non weight bearing      Mobility Bed Mobility Overal bed mobility: Modified Independent                Transfers Overall transfer level: Modified independent                    Balance                                           ADL either performed or assessed with clinical judgement   ADL Overall ADL's : Needs assistance/impaired Eating/Feeding: Set up   Grooming: Set up Grooming Details (indicate cue type and reason): completed oral care during session Upper Body Bathing: Supervision/ safety Upper Body Bathing Details (indicate cue type and reason): wiping with wet wipes under R LE     Upper Body Dressing : Supervision/safety   Lower Body Dressing: Supervision/safety Lower Body Dressing Details (indicate cue type and reason): don  LB clothing during session Toilet Transfer: Modified Independent           Functional mobility during ADLs: Modified independent General ADL Comments: pt has friend present that is a sororiety sister visiting until 08/17/17. advised on house setup. Advised on meal prep and using plastic/ paper products to avoid need to clean dishes until d/c      Vision   Vision Assessment?: No apparent visual deficits     Perception     Praxis      Pertinent Vitals/Pain Pain Assessment: Faces Faces Pain Scale: Hurts even more Pain Location: L UE Pain Descriptors / Indicators: Operative site guarding;Grimacing Pain Intervention(s): Monitored during session;Premedicated before session;Repositioned;Patient requesting pain meds-RN notified     Hand Dominance Right   Extremity/Trunk Assessment Upper Extremity Assessment Upper Extremity Assessment: LUE deficits/detail LUE Deficits / Details: previous ORIF in 2017   Lower Extremity Assessment Lower Extremity Assessment: Overall WFL for tasks assessed   Cervical / Trunk Assessment Cervical / Trunk Assessment: Kyphotic   Communication Communication Communication: No difficulties   Cognition Arousal/Alertness: Awake/alert Behavior During Therapy: WFL for tasks assessed/performed Overall Cognitive Status: Within Functional Limits for tasks assessed  General Comments  pt with post op dressing.     Exercises Exercises: Shoulder Shoulder Exercises Elbow Flexion: AROM;10 reps;Seated;Left Wrist Flexion: AROM;10 reps;Seated;Left Digit Composite Flexion: AROM;Seated;Left   Shoulder Instructions Shoulder Instructions Donning/doffing shirt without moving shoulder: Supervision/safety Method for sponge bathing under operated UE: Supervision/safety Donning/doffing sling/immobilizer: Modified independent Correct positioning of sling/immobilizer: Moderate assistance Pendulum exercises (written  home exercise program): (na) ROM for elbow, wrist and digits of operated UE: Independent Sling wearing schedule (on at all times/off for ADL's): Independent Positioning of UE while sleeping: Modified independent    Home Living Family/patient expects to be discharged to:: Private residence Living Arrangements: Alone Available Help at Discharge: Friend(s)(friend til 08/17/17 / alone until sunday 08/21/17)   Home Access: Level entry     Home Layout: One level                          Prior Functioning/Environment Level of Independence: Needs assistance  Gait / Transfers Assistance Needed: in ADL's / Homemaking Assistance Needed: pt will have an aide to help with bathing/ dressing upon d/c home. pt reports having same level (A) last L UE surg            OT Problem List: Decreased strength;Decreased range of motion;Decreased activity tolerance;Impaired balance (sitting and/or standing);Decreased knowledge of precautions;Pain;Impaired UE functional use      OT Treatment/Interventions:      OT Goals(Current goals can be found in the care plan section) Acute Rehab OT Goals Potential to Achieve Goals: Good  OT Frequency:     Barriers to D/C:            Co-evaluation              AM-PAC PT "6 Clicks" Daily Activity     Outcome Measure Help from another person eating meals?: None Help from another person taking care of personal grooming?: None Help from another person toileting, which includes using toliet, bedpan, or urinal?: None Help from another person bathing (including washing, rinsing, drying)?: A Little Help from another person to put on and taking off regular upper body clothing?: None Help from another person to put on and taking off regular lower body clothing?: None 6 Click Score: 23   End of Session Nurse Communication: Mobility status;Precautions;Weight bearing status  Activity Tolerance: Patient tolerated treatment well Patient left: in bed;with  call bell/phone within reach;with family/visitor present  OT Visit Diagnosis: Unsteadiness on feet (R26.81)                Time: 1937-9024 OT Time Calculation (min): 26 min Charges:  OT General Charges $OT Visit: 1 Visit OT Evaluation $OT Eval Moderate Complexity: 1 Mod G-Codes:      Jeri Modena   OTR/L Pager: [phone] Office: [phone] .   Parke Poisson B 08/16/2017, 10:00 AM

## 2017-08-16 NOTE — Progress Notes (Signed)
Orthopedics Progress Note  Subjective: Pain controlled.  She had a good night.  Objective:  Vitals:   08/15/17 2327 08/16/17 0421  BP: 120/76 (!) 141/75  Pulse: 76 68  Resp: 16 16  Temp: 98.8 F (37.1 C) 98.7 F (37.1 C)  SpO2: 96% 98%    General: Awake and alert  Musculoskeletal: dressing intact, wiggles fingers well Neurovascularly intact  Lab Results  Component Value Date   WBC 4.1 08/15/2017   HGB 12.1 08/16/2017   HCT 36.4 08/16/2017   MCV 99.0 08/15/2017   PLT 318 08/15/2017       Component Value Date/Time   NA 130 (L) 08/16/2017 0602   K 3.5 08/16/2017 0602   CL 92 (L) 08/16/2017 0602   CO2 29 08/16/2017 0602   GLUCOSE 106 (H) 08/16/2017 0602   BUN 8 08/16/2017 0602   CREATININE 0.93 08/16/2017 0602   CALCIUM 9.2 08/16/2017 0602   GFRNONAA >60 08/16/2017 0602   GFRAA >60 08/16/2017 0602    No results found for: INR, PROTIME  Assessment/Plan: POD #1 s/p Procedure(s): OPEN REDUCTION INTERNAL FIXATION (ORIF) PROXIMAL HUMERUS FRACTURE OT this morning prior to Discharge Home Health for OT and non-skilled aide Follow up in two weeks in the office  Remo Lipps R. Veverly Fells, MD 08/16/2017 7:50 AM

## 2017-08-16 NOTE — Discharge Summary (Signed)
Orthopedic Discharge Summary        Physician Discharge Summary  Patient ID: [name] Ruiz MRN: [id number] DOB/AGE: 1945-06-23 72 y.o.  Admit date: 08/15/2017 Discharge date: 08/16/2017   Procedures:  Procedure(s) (LRB): OPEN REDUCTION INTERNAL FIXATION (ORIF) PROXIMAL HUMERUS FRACTURE (Right)  Attending Physician:  Dr. Esmond Plants  Admission Diagnoses:   Right shoulder displaced and comminuted proximal humerus fracture  Discharge Diagnoses:  Right shoulder displaced and comminuted proximal humerus fracture   Past Medical History:  Diagnosis Date  . Anemia    as a child only  . Arthritis   . Constipation due to opioid therapy   . Hypothyroidism   . Proximal humerus fracture    right  . Scoliosis     PCP: Jonathon Jordan, MD   Discharged Condition: good  Hospital Course:  Patient underwent the above stated procedure on 08/15/2017. Patient tolerated the procedure well and brought to the recovery room in good condition and subsequently to the floor. Patient had an uncomplicated hospital course and was stable for discharge.   Disposition: Discharge disposition: 01-Home or Self Care      with follow up in 2 weeks  Home health OT and non skilled Aide   Follow-up Information    Netta Cedars, MD. Call in 2 weeks.   Specialty:  Orthopedic Surgery Why:  [phone] Contact information: [address] [place] [address] [phone]           Discharge Instructions    Call MD / Call 911   Complete by:  As directed    If you experience chest pain or shortness of breath, CALL 911 and be transported to the hospital emergency room.  If you develope a fever above 101 F, pus (white drainage) or increased drainage or redness at the wound, or calf pain, call your surgeon's office.   Constipation Prevention   Complete by:  As directed    Drink plenty of fluids.  Prune juice may be helpful.  You may use a stool softener, such as Colace (over the  counter) 100 mg twice a day.  Use MiraLax (over the counter) for constipation as needed.   Diet - low sodium heart healthy   Complete by:  As directed    Increase activity slowly as tolerated   Complete by:  As directed       Allergies as of 08/16/2017      Reactions   Astemizole Hives, Swelling, Other (See Comments)   Cant breath. throat swells, fainting    Bactrim [sulfamethoxazole-trimethoprim] Anaphylaxis, Hives, Swelling, Other (See Comments)   Stomach ache   Monosodium Glutamate Anaphylaxis, Hives, Swelling   Benadryl [diphenhydramine] Hypertension   Cortisone Hives, Swelling   SWELLING REACTION UNSPECIFIED    Seldane [terfenadine] Hives, Swelling   SWELLING REACTION UNSPECIFIED       Medication List    STOP taking these medications   oxyCODONE 5 MG immediate release tablet Commonly known as:  ROXICODONE     TAKE these medications   acetaminophen 500 MG tablet Commonly known as:  TYLENOL Take 500 mg by mouth every 6 (six) hours as needed for moderate pain or headache.   ARMOUR THYROID 15 MG tablet Generic drug:  thyroid Take 15 mg by mouth once daily   aspirin 325 MG tablet Take 325 mg by mouth daily.   b complex vitamins tablet Take 1 tablet by mouth daily.   BOSWELLIA PO Take 1 capsule by mouth 2 (two) times daily.  cholecalciferol 1000 units tablet Commonly known as:  VITAMIN D Take 4,000 Units by mouth 2 (two) times daily.   DEEP BLUE RELIEF EX Apply 1 application topically 3 (three) times daily as needed (for pain).   DIGESTIVE ENZYMES PO Take 1 capsule by mouth 3 (three) times daily.   EVENING PRIMROSE OIL PO Take 1 capsule by mouth 2 (two) times daily.   FISH OIL PO Take 1 capsule by mouth 3 (three) times daily.   Magnesium 500 MG Tabs Take 500 mg by mouth 2 (two) times daily.   methocarbamol 500 MG tablet Commonly known as:  ROBAXIN Take 1 tablet (500 mg total) by mouth 3 (three) times daily as needed.   OVER THE COUNTER  MEDICATION Apply 1 application topically 3 (three) times daily as needed (for pain). Super White Stuff Rub   oxyCODONE-acetaminophen 5-325 MG tablet Commonly known as:  ROXICET Take 1-2 tablets by mouth every 4 (four) hours as needed for severe pain. What changed:  how much to take   oxyCODONE-acetaminophen 5-325 MG tablet Commonly known as:  PERCOCET Take 1-2 tablets by mouth every 4 (four) hours as needed for severe pain. What changed:  You were already taking a medication with the same name, and this prescription was added. Make sure you understand how and when to take each.   Selenium 200 MCG Caps Take 200 mcg by mouth daily.   sodium chloride 0.65 % Soln nasal spray Commonly known as:  OCEAN Place 2-3 sprays into both nostrils as needed for congestion.   TART CHERRY ADVANCED PO Take 1 capsule by mouth 3 (three) times daily.         Signed: Augustin Schooling 08/16/2017, 7:54 AM  [facility] is now [facility] [address], [place], [place], [place] [address] Phone: [place]

## 2017-08-16 NOTE — Anesthesia Postprocedure Evaluation (Signed)
Anesthesia Post Note  Patient: TENAE GRAZIOSI  Procedure(s) Performed: OPEN REDUCTION INTERNAL FIXATION (ORIF) PROXIMAL HUMERUS FRACTURE (Right Shoulder)     Patient location during evaluation: PACU Anesthesia Type: Regional and General Level of consciousness: awake and alert Pain management: pain level controlled Vital Signs Assessment: post-procedure vital signs reviewed and stable Respiratory status: spontaneous breathing, nonlabored ventilation, respiratory function stable and patient connected to nasal cannula oxygen Cardiovascular status: blood pressure returned to baseline and stable Postop Assessment: no apparent nausea or vomiting Anesthetic complications: no    Last Vitals:  Vitals:   08/16/17 0421 08/16/17 0805  BP: (!) 141/75 138/79  Pulse: 68 71  Resp: 16 18  Temp: 37.1 C 36.8 C  SpO2: 98% 100%    Last Pain:  Vitals:   08/16/17 1045  TempSrc:   PainSc: 5    Pain Goal: Patients Stated Pain Goal: 2 (08/16/17 0945)               Thurmond Butts P [place]

## 2017-08-16 NOTE — Care Management Note (Signed)
Case Management Note  Patient Details  Name: [name] Ruiz MRN: [id number] Date of Birth: March 29, 1946  Subjective/Objective:  72 yr old female s/p ORIF of right proximal humerus fracture.                  Action/Plan: Case manager spoke with patient concerning discharge plan. Choice for [place] was offered, referral was called to [place], [place]. Patient will have family and friends assisting at her discharge.     Expected Discharge Date:  08/16/17               Expected Discharge Plan:  [place]  In-House Referral:  NA  Discharge planning Services  CM Consult  Post Acute Care Choice:  Home Health Choice offered to:  Patient  DME Arranged:  N/A DME Agency:  NA  HH Arranged:  PT, OT, Nurse's Aide [place] Agency:  [place]  Status of Service:  Completed, signed off  If discussed at [place] of Stay Meetings, dates discussed:    Additional Comments:  Ninfa Meeker, RN 08/16/2017, 11:01 AM

## 2017-08-16 NOTE — Care Management Obs Status (Signed)
[facility] NOTIFICATION   Patient Details  Name: [name] Ruiz MRN: [id number] Date of Birth: December 29, 1945   Medicare Observation Status Notification Given:  Yes    Ninfa Meeker, RN 08/16/2017, 11:09 AM

## 2017-08-16 NOTE — Op Note (Signed)
NAME: [name] Ruiz, BORGER MEDICAL RECORD [id number] ACCOUNT [id number] DATE OF BIRTH:21-Jul-1945 FACILITY: [facility] LOCATION: [place], [place]  OPERATIVE REPORT  DATE OF PROCEDURE:  08/15/2017  PREOPERATIVE DIAGNOSIS:  Displaced right proximal humerus fracture.  POSTOPERATIVE DIAGNOSIS:  Displaced right proximal humerus fracture.  PROCEDURE PERFORMED:  Open reduction and internal fixation of displaced and comminuted proximal humerus fracture using Biomet S3 plate.  ATTENDING SURGEON:   Esmond Plants, MD   ASSISTANT:  Darol Destine, PA-C who was scrubbed in the entire procedure and necessary for satisfactory completion of the surgery.  ANESTHESIA:  General anesthesia was used plus interscalene block.  ESTIMATED BLOOD LOSS:  Minimal.  FLUID REPLACEMENT:  1000 mL crystalloid.    COUNTS:  Correct.    COMPLICATIONS:  There were no complications.    Preoperative antibiotics were given.  INDICATIONS:  The patient is a 72 year old female with worsening right shoulder pain and dysfunction secondary to comminuted and displaced proximal humerus fracture.  The patient's humeral shaft was pulled forward over humeral head, which was rolled  posteriorly and into varus.  Given the extreme displacement and comminution of her fracture and likelihood to progress to a malunion, we discussed options for management and recommended surgical reduction and stabilization.  Risks and benefits of surgery  were discussed, informed consent obtained.  DESCRIPTION OF PROCEDURE:  After adequate level of anesthesia was achieved, the patient was positioned in modified beach chair position.  Right shoulder was correctly identified and sterilely prepped and draped in the usual manner.  Time-out called,  verifying correct patient, correct side.  We then made an anterior deltopectoral incision, started at the coracoid process extending down to the anterior humerus.  Dissection down through  subcutaneous tissues using the needle tip Bovie.  I identified  cephalic vein, took it laterally with the deltoid.  The conjoined tendon was then identified and retracted medially.  This exposed the humeral fracture site.  Fracture hematoma was contained within the soft tissues.  We identified the biceps tendon and  the bicipital groove for anatomic landmarks and I was able to palpate the subscapularis well.  C-arm was draped into the field.  This allowed [place] to visualize the patient's head which was rolled posteriorly and into varus.  We were able to use a Cobb  elevator to manipulate the head on the shaft to get the shaft back underneath the head into an appropriate position.  We then placed our S3 Biomet plate on the lateral humerus just lateral to the bicipital groove.  We placed a central guide pin, which we  were pleased with the position of that pin relative to the humeral head and the tuberosity position on the humerus looked good.  The patient's humeral head remained in a little bit of varus relative to the shaft, which seemed to be the most stable  position for it.  We were able to get it stable as well on the lateral imaging.  The greater tuberosity was kicked out a little bit posteriorly, but the tuberosity position relative to the head looked good, especially on the AP views and on oblique  views.  Also, we had the lesser tuberosity anterior to the anterior humeral shaft.  We were pleased with that position.  We then placed our shaft screws into the plate to suck the plate down, nonlocked initially and then locked, and then we additionally  did a nonlocked screw proximally to try to pull that head back over to correct some of the  varus.  It really did not improve that, that much.  We were then put nonthreaded pegs into the remaining holes and then we exchanged our nonlocked screw for a  locked screw at the end to make sure it would not back out.  All screws were the appropriate length and double  checked with multiplanar C-arm.  We were pleased with our reduction, which was vastly improved, not anatomic, but much improved.  The stable  position for this patient's shoulder seemed to be a little bit of varus, but the tuberosity position was not unacceptable.  At this point, we irrigated thoroughly.  We then went ahead and repaired the deltopectoral interval with 0 Vicryl suture followed  by 2-0 Vicryl for subcutaneous closure and 4-0 Monocryl for skin.  Steri-Strips were applied followed by sterile dressing.  The patient tolerated the surgery well.  AN/NUANCE  D:08/15/2017 T:08/16/2017 JOB:[id number]

## 2017-08-16 NOTE — Progress Notes (Signed)
Pt and friend given D/C instructions with Rx's, verbal understanding was provided. Pt's incision is clean and dry with no sign of infection. Pt's IV was removed prior to D/C. Pt's Home Health was arranged prior to D/C. Pt D/C'd home via wheelchair per MD order. Pt is stable @ D/C and has no other needs at this time. Holli Humbles, RN

## 2017-08-17 DIAGNOSIS — S43001D Unspecified subluxation of right shoulder joint, subsequent encounter: Secondary | ICD-10-CM | POA: Diagnosis not present

## 2017-08-17 DIAGNOSIS — E039 Hypothyroidism, unspecified: Secondary | ICD-10-CM | POA: Diagnosis not present

## 2017-08-17 DIAGNOSIS — Z7982 Long term (current) use of aspirin: Secondary | ICD-10-CM | POA: Diagnosis not present

## 2017-08-17 DIAGNOSIS — W19XXXD Unspecified fall, subsequent encounter: Secondary | ICD-10-CM | POA: Diagnosis not present

## 2017-08-17 DIAGNOSIS — M419 Scoliosis, unspecified: Secondary | ICD-10-CM | POA: Diagnosis not present

## 2017-08-17 DIAGNOSIS — M1990 Unspecified osteoarthritis, unspecified site: Secondary | ICD-10-CM | POA: Diagnosis not present

## 2017-08-17 DIAGNOSIS — S42201D Unspecified fracture of upper end of right humerus, subsequent encounter for fracture with routine healing: Secondary | ICD-10-CM | POA: Diagnosis not present

## 2017-08-19 DIAGNOSIS — M1990 Unspecified osteoarthritis, unspecified site: Secondary | ICD-10-CM | POA: Diagnosis not present

## 2017-08-19 DIAGNOSIS — W19XXXD Unspecified fall, subsequent encounter: Secondary | ICD-10-CM | POA: Diagnosis not present

## 2017-08-19 DIAGNOSIS — S42201D Unspecified fracture of upper end of right humerus, subsequent encounter for fracture with routine healing: Secondary | ICD-10-CM | POA: Diagnosis not present

## 2017-08-19 DIAGNOSIS — E039 Hypothyroidism, unspecified: Secondary | ICD-10-CM | POA: Diagnosis not present

## 2017-08-19 DIAGNOSIS — S43001D Unspecified subluxation of right shoulder joint, subsequent encounter: Secondary | ICD-10-CM | POA: Diagnosis not present

## 2017-08-19 DIAGNOSIS — M419 Scoliosis, unspecified: Secondary | ICD-10-CM | POA: Diagnosis not present

## 2017-08-23 DIAGNOSIS — M1990 Unspecified osteoarthritis, unspecified site: Secondary | ICD-10-CM | POA: Diagnosis not present

## 2017-08-23 DIAGNOSIS — W19XXXD Unspecified fall, subsequent encounter: Secondary | ICD-10-CM | POA: Diagnosis not present

## 2017-08-23 DIAGNOSIS — S43001D Unspecified subluxation of right shoulder joint, subsequent encounter: Secondary | ICD-10-CM | POA: Diagnosis not present

## 2017-08-23 DIAGNOSIS — M419 Scoliosis, unspecified: Secondary | ICD-10-CM | POA: Diagnosis not present

## 2017-08-23 DIAGNOSIS — S42201D Unspecified fracture of upper end of right humerus, subsequent encounter for fracture with routine healing: Secondary | ICD-10-CM | POA: Diagnosis not present

## 2017-08-23 DIAGNOSIS — E039 Hypothyroidism, unspecified: Secondary | ICD-10-CM | POA: Diagnosis not present

## 2017-08-25 DIAGNOSIS — W19XXXD Unspecified fall, subsequent encounter: Secondary | ICD-10-CM | POA: Diagnosis not present

## 2017-08-25 DIAGNOSIS — S43001D Unspecified subluxation of right shoulder joint, subsequent encounter: Secondary | ICD-10-CM | POA: Diagnosis not present

## 2017-08-25 DIAGNOSIS — M1990 Unspecified osteoarthritis, unspecified site: Secondary | ICD-10-CM | POA: Diagnosis not present

## 2017-08-25 DIAGNOSIS — M419 Scoliosis, unspecified: Secondary | ICD-10-CM | POA: Diagnosis not present

## 2017-08-25 DIAGNOSIS — S42201D Unspecified fracture of upper end of right humerus, subsequent encounter for fracture with routine healing: Secondary | ICD-10-CM | POA: Diagnosis not present

## 2017-08-25 DIAGNOSIS — E039 Hypothyroidism, unspecified: Secondary | ICD-10-CM | POA: Diagnosis not present

## 2017-08-29 DIAGNOSIS — W19XXXD Unspecified fall, subsequent encounter: Secondary | ICD-10-CM | POA: Diagnosis not present

## 2017-08-29 DIAGNOSIS — E039 Hypothyroidism, unspecified: Secondary | ICD-10-CM | POA: Diagnosis not present

## 2017-08-29 DIAGNOSIS — M1990 Unspecified osteoarthritis, unspecified site: Secondary | ICD-10-CM | POA: Diagnosis not present

## 2017-08-29 DIAGNOSIS — S43001D Unspecified subluxation of right shoulder joint, subsequent encounter: Secondary | ICD-10-CM | POA: Diagnosis not present

## 2017-08-29 DIAGNOSIS — M419 Scoliosis, unspecified: Secondary | ICD-10-CM | POA: Diagnosis not present

## 2017-08-29 DIAGNOSIS — S42201D Unspecified fracture of upper end of right humerus, subsequent encounter for fracture with routine healing: Secondary | ICD-10-CM | POA: Diagnosis not present

## 2017-08-30 DIAGNOSIS — Z4789 Encounter for other orthopedic aftercare: Secondary | ICD-10-CM | POA: Diagnosis not present

## 2017-08-31 DIAGNOSIS — S43001D Unspecified subluxation of right shoulder joint, subsequent encounter: Secondary | ICD-10-CM | POA: Diagnosis not present

## 2017-08-31 DIAGNOSIS — M1990 Unspecified osteoarthritis, unspecified site: Secondary | ICD-10-CM | POA: Diagnosis not present

## 2017-08-31 DIAGNOSIS — S42201D Unspecified fracture of upper end of right humerus, subsequent encounter for fracture with routine healing: Secondary | ICD-10-CM | POA: Diagnosis not present

## 2017-08-31 DIAGNOSIS — W19XXXD Unspecified fall, subsequent encounter: Secondary | ICD-10-CM | POA: Diagnosis not present

## 2017-08-31 DIAGNOSIS — E039 Hypothyroidism, unspecified: Secondary | ICD-10-CM | POA: Diagnosis not present

## 2017-08-31 DIAGNOSIS — M419 Scoliosis, unspecified: Secondary | ICD-10-CM | POA: Diagnosis not present

## 2017-09-01 DIAGNOSIS — E039 Hypothyroidism, unspecified: Secondary | ICD-10-CM | POA: Diagnosis not present

## 2017-09-01 DIAGNOSIS — S42201D Unspecified fracture of upper end of right humerus, subsequent encounter for fracture with routine healing: Secondary | ICD-10-CM | POA: Diagnosis not present

## 2017-09-01 DIAGNOSIS — M1990 Unspecified osteoarthritis, unspecified site: Secondary | ICD-10-CM | POA: Diagnosis not present

## 2017-09-01 DIAGNOSIS — W19XXXD Unspecified fall, subsequent encounter: Secondary | ICD-10-CM | POA: Diagnosis not present

## 2017-09-01 DIAGNOSIS — S43001D Unspecified subluxation of right shoulder joint, subsequent encounter: Secondary | ICD-10-CM | POA: Diagnosis not present

## 2017-09-01 DIAGNOSIS — M419 Scoliosis, unspecified: Secondary | ICD-10-CM | POA: Diagnosis not present

## 2017-09-02 DIAGNOSIS — S43001D Unspecified subluxation of right shoulder joint, subsequent encounter: Secondary | ICD-10-CM | POA: Diagnosis not present

## 2017-09-02 DIAGNOSIS — E039 Hypothyroidism, unspecified: Secondary | ICD-10-CM | POA: Diagnosis not present

## 2017-09-02 DIAGNOSIS — S42201D Unspecified fracture of upper end of right humerus, subsequent encounter for fracture with routine healing: Secondary | ICD-10-CM | POA: Diagnosis not present

## 2017-09-02 DIAGNOSIS — W19XXXD Unspecified fall, subsequent encounter: Secondary | ICD-10-CM | POA: Diagnosis not present

## 2017-09-02 DIAGNOSIS — M419 Scoliosis, unspecified: Secondary | ICD-10-CM | POA: Diagnosis not present

## 2017-09-02 DIAGNOSIS — M1990 Unspecified osteoarthritis, unspecified site: Secondary | ICD-10-CM | POA: Diagnosis not present

## 2017-09-06 DIAGNOSIS — M419 Scoliosis, unspecified: Secondary | ICD-10-CM | POA: Diagnosis not present

## 2017-09-06 DIAGNOSIS — S43001D Unspecified subluxation of right shoulder joint, subsequent encounter: Secondary | ICD-10-CM | POA: Diagnosis not present

## 2017-09-06 DIAGNOSIS — E039 Hypothyroidism, unspecified: Secondary | ICD-10-CM | POA: Diagnosis not present

## 2017-09-06 DIAGNOSIS — W19XXXD Unspecified fall, subsequent encounter: Secondary | ICD-10-CM | POA: Diagnosis not present

## 2017-09-06 DIAGNOSIS — S42201D Unspecified fracture of upper end of right humerus, subsequent encounter for fracture with routine healing: Secondary | ICD-10-CM | POA: Diagnosis not present

## 2017-09-06 DIAGNOSIS — M1990 Unspecified osteoarthritis, unspecified site: Secondary | ICD-10-CM | POA: Diagnosis not present

## 2017-09-08 DIAGNOSIS — M1990 Unspecified osteoarthritis, unspecified site: Secondary | ICD-10-CM | POA: Diagnosis not present

## 2017-09-08 DIAGNOSIS — S42201D Unspecified fracture of upper end of right humerus, subsequent encounter for fracture with routine healing: Secondary | ICD-10-CM | POA: Diagnosis not present

## 2017-09-08 DIAGNOSIS — E039 Hypothyroidism, unspecified: Secondary | ICD-10-CM | POA: Diagnosis not present

## 2017-09-08 DIAGNOSIS — S43001D Unspecified subluxation of right shoulder joint, subsequent encounter: Secondary | ICD-10-CM | POA: Diagnosis not present

## 2017-09-08 DIAGNOSIS — M419 Scoliosis, unspecified: Secondary | ICD-10-CM | POA: Diagnosis not present

## 2017-09-08 DIAGNOSIS — W19XXXD Unspecified fall, subsequent encounter: Secondary | ICD-10-CM | POA: Diagnosis not present

## 2017-09-12 DIAGNOSIS — M1990 Unspecified osteoarthritis, unspecified site: Secondary | ICD-10-CM | POA: Diagnosis not present

## 2017-09-12 DIAGNOSIS — W19XXXD Unspecified fall, subsequent encounter: Secondary | ICD-10-CM | POA: Diagnosis not present

## 2017-09-12 DIAGNOSIS — M419 Scoliosis, unspecified: Secondary | ICD-10-CM | POA: Diagnosis not present

## 2017-09-12 DIAGNOSIS — S43001D Unspecified subluxation of right shoulder joint, subsequent encounter: Secondary | ICD-10-CM | POA: Diagnosis not present

## 2017-09-12 DIAGNOSIS — E039 Hypothyroidism, unspecified: Secondary | ICD-10-CM | POA: Diagnosis not present

## 2017-09-12 DIAGNOSIS — S42201D Unspecified fracture of upper end of right humerus, subsequent encounter for fracture with routine healing: Secondary | ICD-10-CM | POA: Diagnosis not present

## 2017-09-13 DIAGNOSIS — M1990 Unspecified osteoarthritis, unspecified site: Secondary | ICD-10-CM | POA: Diagnosis not present

## 2017-09-13 DIAGNOSIS — E039 Hypothyroidism, unspecified: Secondary | ICD-10-CM | POA: Diagnosis not present

## 2017-09-13 DIAGNOSIS — S42201D Unspecified fracture of upper end of right humerus, subsequent encounter for fracture with routine healing: Secondary | ICD-10-CM | POA: Diagnosis not present

## 2017-09-13 DIAGNOSIS — S43001D Unspecified subluxation of right shoulder joint, subsequent encounter: Secondary | ICD-10-CM | POA: Diagnosis not present

## 2017-09-13 DIAGNOSIS — W19XXXD Unspecified fall, subsequent encounter: Secondary | ICD-10-CM | POA: Diagnosis not present

## 2017-09-13 DIAGNOSIS — M419 Scoliosis, unspecified: Secondary | ICD-10-CM | POA: Diagnosis not present

## 2017-09-16 DIAGNOSIS — E039 Hypothyroidism, unspecified: Secondary | ICD-10-CM | POA: Diagnosis not present

## 2017-09-16 DIAGNOSIS — S42201D Unspecified fracture of upper end of right humerus, subsequent encounter for fracture with routine healing: Secondary | ICD-10-CM | POA: Diagnosis not present

## 2017-09-16 DIAGNOSIS — M1990 Unspecified osteoarthritis, unspecified site: Secondary | ICD-10-CM | POA: Diagnosis not present

## 2017-09-16 DIAGNOSIS — W19XXXD Unspecified fall, subsequent encounter: Secondary | ICD-10-CM | POA: Diagnosis not present

## 2017-09-16 DIAGNOSIS — M419 Scoliosis, unspecified: Secondary | ICD-10-CM | POA: Diagnosis not present

## 2017-09-16 DIAGNOSIS — S43001D Unspecified subluxation of right shoulder joint, subsequent encounter: Secondary | ICD-10-CM | POA: Diagnosis not present

## 2017-09-21 DIAGNOSIS — S43001D Unspecified subluxation of right shoulder joint, subsequent encounter: Secondary | ICD-10-CM | POA: Diagnosis not present

## 2017-09-21 DIAGNOSIS — M1990 Unspecified osteoarthritis, unspecified site: Secondary | ICD-10-CM | POA: Diagnosis not present

## 2017-09-21 DIAGNOSIS — W19XXXD Unspecified fall, subsequent encounter: Secondary | ICD-10-CM | POA: Diagnosis not present

## 2017-09-21 DIAGNOSIS — S42201D Unspecified fracture of upper end of right humerus, subsequent encounter for fracture with routine healing: Secondary | ICD-10-CM | POA: Diagnosis not present

## 2017-09-21 DIAGNOSIS — E039 Hypothyroidism, unspecified: Secondary | ICD-10-CM | POA: Diagnosis not present

## 2017-09-21 DIAGNOSIS — M419 Scoliosis, unspecified: Secondary | ICD-10-CM | POA: Diagnosis not present

## 2017-09-23 DIAGNOSIS — W19XXXD Unspecified fall, subsequent encounter: Secondary | ICD-10-CM | POA: Diagnosis not present

## 2017-09-23 DIAGNOSIS — E039 Hypothyroidism, unspecified: Secondary | ICD-10-CM | POA: Diagnosis not present

## 2017-09-23 DIAGNOSIS — M1990 Unspecified osteoarthritis, unspecified site: Secondary | ICD-10-CM | POA: Diagnosis not present

## 2017-09-23 DIAGNOSIS — S42201D Unspecified fracture of upper end of right humerus, subsequent encounter for fracture with routine healing: Secondary | ICD-10-CM | POA: Diagnosis not present

## 2017-09-23 DIAGNOSIS — S43001D Unspecified subluxation of right shoulder joint, subsequent encounter: Secondary | ICD-10-CM | POA: Diagnosis not present

## 2017-09-23 DIAGNOSIS — M419 Scoliosis, unspecified: Secondary | ICD-10-CM | POA: Diagnosis not present

## 2017-09-27 DIAGNOSIS — Z4789 Encounter for other orthopedic aftercare: Secondary | ICD-10-CM | POA: Diagnosis not present

## 2017-09-27 DIAGNOSIS — S42291D Other displaced fracture of upper end of right humerus, subsequent encounter for fracture with routine healing: Secondary | ICD-10-CM | POA: Diagnosis not present

## 2017-09-28 DIAGNOSIS — E039 Hypothyroidism, unspecified: Secondary | ICD-10-CM | POA: Diagnosis not present

## 2017-09-28 DIAGNOSIS — S43001D Unspecified subluxation of right shoulder joint, subsequent encounter: Secondary | ICD-10-CM | POA: Diagnosis not present

## 2017-09-28 DIAGNOSIS — M419 Scoliosis, unspecified: Secondary | ICD-10-CM | POA: Diagnosis not present

## 2017-09-28 DIAGNOSIS — M1990 Unspecified osteoarthritis, unspecified site: Secondary | ICD-10-CM | POA: Diagnosis not present

## 2017-09-28 DIAGNOSIS — W19XXXD Unspecified fall, subsequent encounter: Secondary | ICD-10-CM | POA: Diagnosis not present

## 2017-09-28 DIAGNOSIS — S42201D Unspecified fracture of upper end of right humerus, subsequent encounter for fracture with routine healing: Secondary | ICD-10-CM | POA: Diagnosis not present

## 2017-09-30 DIAGNOSIS — M1990 Unspecified osteoarthritis, unspecified site: Secondary | ICD-10-CM | POA: Diagnosis not present

## 2017-09-30 DIAGNOSIS — E039 Hypothyroidism, unspecified: Secondary | ICD-10-CM | POA: Diagnosis not present

## 2017-09-30 DIAGNOSIS — M419 Scoliosis, unspecified: Secondary | ICD-10-CM | POA: Diagnosis not present

## 2017-09-30 DIAGNOSIS — S43001D Unspecified subluxation of right shoulder joint, subsequent encounter: Secondary | ICD-10-CM | POA: Diagnosis not present

## 2017-09-30 DIAGNOSIS — S42201D Unspecified fracture of upper end of right humerus, subsequent encounter for fracture with routine healing: Secondary | ICD-10-CM | POA: Diagnosis not present

## 2017-09-30 DIAGNOSIS — W19XXXD Unspecified fall, subsequent encounter: Secondary | ICD-10-CM | POA: Diagnosis not present

## 2017-10-05 DIAGNOSIS — S42201D Unspecified fracture of upper end of right humerus, subsequent encounter for fracture with routine healing: Secondary | ICD-10-CM | POA: Diagnosis not present

## 2017-10-05 DIAGNOSIS — S43001D Unspecified subluxation of right shoulder joint, subsequent encounter: Secondary | ICD-10-CM | POA: Diagnosis not present

## 2017-10-05 DIAGNOSIS — E039 Hypothyroidism, unspecified: Secondary | ICD-10-CM | POA: Diagnosis not present

## 2017-10-05 DIAGNOSIS — M419 Scoliosis, unspecified: Secondary | ICD-10-CM | POA: Diagnosis not present

## 2017-10-05 DIAGNOSIS — W19XXXD Unspecified fall, subsequent encounter: Secondary | ICD-10-CM | POA: Diagnosis not present

## 2017-10-05 DIAGNOSIS — M1990 Unspecified osteoarthritis, unspecified site: Secondary | ICD-10-CM | POA: Diagnosis not present

## 2017-10-07 DIAGNOSIS — M1990 Unspecified osteoarthritis, unspecified site: Secondary | ICD-10-CM | POA: Diagnosis not present

## 2017-10-07 DIAGNOSIS — M419 Scoliosis, unspecified: Secondary | ICD-10-CM | POA: Diagnosis not present

## 2017-10-07 DIAGNOSIS — S42201D Unspecified fracture of upper end of right humerus, subsequent encounter for fracture with routine healing: Secondary | ICD-10-CM | POA: Diagnosis not present

## 2017-10-07 DIAGNOSIS — E039 Hypothyroidism, unspecified: Secondary | ICD-10-CM | POA: Diagnosis not present

## 2017-10-07 DIAGNOSIS — S43001D Unspecified subluxation of right shoulder joint, subsequent encounter: Secondary | ICD-10-CM | POA: Diagnosis not present

## 2017-10-07 DIAGNOSIS — W19XXXD Unspecified fall, subsequent encounter: Secondary | ICD-10-CM | POA: Diagnosis not present

## 2017-10-11 DIAGNOSIS — S42201D Unspecified fracture of upper end of right humerus, subsequent encounter for fracture with routine healing: Secondary | ICD-10-CM | POA: Diagnosis not present

## 2017-10-11 DIAGNOSIS — E039 Hypothyroidism, unspecified: Secondary | ICD-10-CM | POA: Diagnosis not present

## 2017-10-11 DIAGNOSIS — S43001D Unspecified subluxation of right shoulder joint, subsequent encounter: Secondary | ICD-10-CM | POA: Diagnosis not present

## 2017-10-11 DIAGNOSIS — M419 Scoliosis, unspecified: Secondary | ICD-10-CM | POA: Diagnosis not present

## 2017-10-11 DIAGNOSIS — M1990 Unspecified osteoarthritis, unspecified site: Secondary | ICD-10-CM | POA: Diagnosis not present

## 2017-10-11 DIAGNOSIS — W19XXXD Unspecified fall, subsequent encounter: Secondary | ICD-10-CM | POA: Diagnosis not present

## 2017-10-14 DIAGNOSIS — W19XXXD Unspecified fall, subsequent encounter: Secondary | ICD-10-CM | POA: Diagnosis not present

## 2017-10-14 DIAGNOSIS — S42201D Unspecified fracture of upper end of right humerus, subsequent encounter for fracture with routine healing: Secondary | ICD-10-CM | POA: Diagnosis not present

## 2017-10-14 DIAGNOSIS — M1990 Unspecified osteoarthritis, unspecified site: Secondary | ICD-10-CM | POA: Diagnosis not present

## 2017-10-14 DIAGNOSIS — E039 Hypothyroidism, unspecified: Secondary | ICD-10-CM | POA: Diagnosis not present

## 2017-10-14 DIAGNOSIS — S43001D Unspecified subluxation of right shoulder joint, subsequent encounter: Secondary | ICD-10-CM | POA: Diagnosis not present

## 2017-10-14 DIAGNOSIS — M419 Scoliosis, unspecified: Secondary | ICD-10-CM | POA: Diagnosis not present

## 2017-10-16 DIAGNOSIS — M1990 Unspecified osteoarthritis, unspecified site: Secondary | ICD-10-CM | POA: Diagnosis not present

## 2017-10-16 DIAGNOSIS — S43001D Unspecified subluxation of right shoulder joint, subsequent encounter: Secondary | ICD-10-CM | POA: Diagnosis not present

## 2017-10-16 DIAGNOSIS — W19XXXD Unspecified fall, subsequent encounter: Secondary | ICD-10-CM | POA: Diagnosis not present

## 2017-10-16 DIAGNOSIS — S42201D Unspecified fracture of upper end of right humerus, subsequent encounter for fracture with routine healing: Secondary | ICD-10-CM | POA: Diagnosis not present

## 2017-10-16 DIAGNOSIS — Z7982 Long term (current) use of aspirin: Secondary | ICD-10-CM | POA: Diagnosis not present

## 2017-10-16 DIAGNOSIS — M419 Scoliosis, unspecified: Secondary | ICD-10-CM | POA: Diagnosis not present

## 2017-10-16 DIAGNOSIS — E039 Hypothyroidism, unspecified: Secondary | ICD-10-CM | POA: Diagnosis not present

## 2017-10-17 DIAGNOSIS — S43001D Unspecified subluxation of right shoulder joint, subsequent encounter: Secondary | ICD-10-CM | POA: Diagnosis not present

## 2017-10-17 DIAGNOSIS — M1990 Unspecified osteoarthritis, unspecified site: Secondary | ICD-10-CM | POA: Diagnosis not present

## 2017-10-17 DIAGNOSIS — E039 Hypothyroidism, unspecified: Secondary | ICD-10-CM | POA: Diagnosis not present

## 2017-10-17 DIAGNOSIS — S42201D Unspecified fracture of upper end of right humerus, subsequent encounter for fracture with routine healing: Secondary | ICD-10-CM | POA: Diagnosis not present

## 2017-10-17 DIAGNOSIS — W19XXXD Unspecified fall, subsequent encounter: Secondary | ICD-10-CM | POA: Diagnosis not present

## 2017-10-17 DIAGNOSIS — M419 Scoliosis, unspecified: Secondary | ICD-10-CM | POA: Diagnosis not present

## 2017-10-20 DIAGNOSIS — E039 Hypothyroidism, unspecified: Secondary | ICD-10-CM | POA: Diagnosis not present

## 2017-10-20 DIAGNOSIS — M419 Scoliosis, unspecified: Secondary | ICD-10-CM | POA: Diagnosis not present

## 2017-10-20 DIAGNOSIS — W19XXXD Unspecified fall, subsequent encounter: Secondary | ICD-10-CM | POA: Diagnosis not present

## 2017-10-20 DIAGNOSIS — S43001D Unspecified subluxation of right shoulder joint, subsequent encounter: Secondary | ICD-10-CM | POA: Diagnosis not present

## 2017-10-20 DIAGNOSIS — S42201D Unspecified fracture of upper end of right humerus, subsequent encounter for fracture with routine healing: Secondary | ICD-10-CM | POA: Diagnosis not present

## 2017-10-20 DIAGNOSIS — M1990 Unspecified osteoarthritis, unspecified site: Secondary | ICD-10-CM | POA: Diagnosis not present

## 2017-10-24 DIAGNOSIS — S43001D Unspecified subluxation of right shoulder joint, subsequent encounter: Secondary | ICD-10-CM | POA: Diagnosis not present

## 2017-10-24 DIAGNOSIS — M1990 Unspecified osteoarthritis, unspecified site: Secondary | ICD-10-CM | POA: Diagnosis not present

## 2017-10-24 DIAGNOSIS — S42201D Unspecified fracture of upper end of right humerus, subsequent encounter for fracture with routine healing: Secondary | ICD-10-CM | POA: Diagnosis not present

## 2017-10-24 DIAGNOSIS — W19XXXD Unspecified fall, subsequent encounter: Secondary | ICD-10-CM | POA: Diagnosis not present

## 2017-10-24 DIAGNOSIS — E039 Hypothyroidism, unspecified: Secondary | ICD-10-CM | POA: Diagnosis not present

## 2017-10-24 DIAGNOSIS — M419 Scoliosis, unspecified: Secondary | ICD-10-CM | POA: Diagnosis not present

## 2017-10-25 DIAGNOSIS — S4291XD Fracture of right shoulder girdle, part unspecified, subsequent encounter for fracture with routine healing: Secondary | ICD-10-CM | POA: Diagnosis not present

## 2017-10-25 DIAGNOSIS — Z4789 Encounter for other orthopedic aftercare: Secondary | ICD-10-CM | POA: Diagnosis not present

## 2017-10-26 DIAGNOSIS — W19XXXD Unspecified fall, subsequent encounter: Secondary | ICD-10-CM | POA: Diagnosis not present

## 2017-10-26 DIAGNOSIS — M419 Scoliosis, unspecified: Secondary | ICD-10-CM | POA: Diagnosis not present

## 2017-10-26 DIAGNOSIS — E039 Hypothyroidism, unspecified: Secondary | ICD-10-CM | POA: Diagnosis not present

## 2017-10-26 DIAGNOSIS — S43001D Unspecified subluxation of right shoulder joint, subsequent encounter: Secondary | ICD-10-CM | POA: Diagnosis not present

## 2017-10-26 DIAGNOSIS — S42201D Unspecified fracture of upper end of right humerus, subsequent encounter for fracture with routine healing: Secondary | ICD-10-CM | POA: Diagnosis not present

## 2017-10-26 DIAGNOSIS — M1990 Unspecified osteoarthritis, unspecified site: Secondary | ICD-10-CM | POA: Diagnosis not present

## 2017-10-31 DIAGNOSIS — S43001D Unspecified subluxation of right shoulder joint, subsequent encounter: Secondary | ICD-10-CM | POA: Diagnosis not present

## 2017-10-31 DIAGNOSIS — S42201D Unspecified fracture of upper end of right humerus, subsequent encounter for fracture with routine healing: Secondary | ICD-10-CM | POA: Diagnosis not present

## 2017-10-31 DIAGNOSIS — E039 Hypothyroidism, unspecified: Secondary | ICD-10-CM | POA: Diagnosis not present

## 2017-10-31 DIAGNOSIS — W19XXXD Unspecified fall, subsequent encounter: Secondary | ICD-10-CM | POA: Diagnosis not present

## 2017-10-31 DIAGNOSIS — M419 Scoliosis, unspecified: Secondary | ICD-10-CM | POA: Diagnosis not present

## 2017-10-31 DIAGNOSIS — M1990 Unspecified osteoarthritis, unspecified site: Secondary | ICD-10-CM | POA: Diagnosis not present

## 2017-11-02 DIAGNOSIS — M1990 Unspecified osteoarthritis, unspecified site: Secondary | ICD-10-CM | POA: Diagnosis not present

## 2017-11-02 DIAGNOSIS — S42201D Unspecified fracture of upper end of right humerus, subsequent encounter for fracture with routine healing: Secondary | ICD-10-CM | POA: Diagnosis not present

## 2017-11-02 DIAGNOSIS — W19XXXD Unspecified fall, subsequent encounter: Secondary | ICD-10-CM | POA: Diagnosis not present

## 2017-11-02 DIAGNOSIS — M419 Scoliosis, unspecified: Secondary | ICD-10-CM | POA: Diagnosis not present

## 2017-11-02 DIAGNOSIS — E039 Hypothyroidism, unspecified: Secondary | ICD-10-CM | POA: Diagnosis not present

## 2017-11-02 DIAGNOSIS — S43001D Unspecified subluxation of right shoulder joint, subsequent encounter: Secondary | ICD-10-CM | POA: Diagnosis not present

## 2017-11-04 DIAGNOSIS — Z136 Encounter for screening for cardiovascular disorders: Secondary | ICD-10-CM | POA: Diagnosis not present

## 2017-11-04 DIAGNOSIS — R5383 Other fatigue: Secondary | ICD-10-CM | POA: Diagnosis not present

## 2017-11-04 DIAGNOSIS — R7989 Other specified abnormal findings of blood chemistry: Secondary | ICD-10-CM | POA: Diagnosis not present

## 2017-11-04 DIAGNOSIS — E559 Vitamin D deficiency, unspecified: Secondary | ICD-10-CM | POA: Diagnosis not present

## 2017-11-04 DIAGNOSIS — E063 Autoimmune thyroiditis: Secondary | ICD-10-CM | POA: Diagnosis not present

## 2017-11-15 DIAGNOSIS — D72819 Decreased white blood cell count, unspecified: Secondary | ICD-10-CM | POA: Diagnosis not present

## 2017-11-30 DIAGNOSIS — S0181XA Laceration without foreign body of other part of head, initial encounter: Secondary | ICD-10-CM | POA: Diagnosis not present

## 2017-11-30 DIAGNOSIS — R51 Headache: Secondary | ICD-10-CM | POA: Diagnosis not present

## 2017-11-30 DIAGNOSIS — S066X0A Traumatic subarachnoid hemorrhage without loss of consciousness, initial encounter: Secondary | ICD-10-CM | POA: Diagnosis not present

## 2017-11-30 DIAGNOSIS — S0990XA Unspecified injury of head, initial encounter: Secondary | ICD-10-CM | POA: Diagnosis not present

## 2017-11-30 DIAGNOSIS — Z7982 Long term (current) use of aspirin: Secondary | ICD-10-CM | POA: Diagnosis not present

## 2017-11-30 DIAGNOSIS — I609 Nontraumatic subarachnoid hemorrhage, unspecified: Secondary | ICD-10-CM | POA: Diagnosis not present

## 2017-11-30 DIAGNOSIS — G8911 Acute pain due to trauma: Secondary | ICD-10-CM | POA: Diagnosis not present

## 2017-12-14 DIAGNOSIS — D472 Monoclonal gammopathy: Secondary | ICD-10-CM | POA: Diagnosis not present

## 2017-12-14 DIAGNOSIS — I129 Hypertensive chronic kidney disease with stage 1 through stage 4 chronic kidney disease, or unspecified chronic kidney disease: Secondary | ICD-10-CM | POA: Diagnosis not present

## 2017-12-14 DIAGNOSIS — M359 Systemic involvement of connective tissue, unspecified: Secondary | ICD-10-CM | POA: Diagnosis not present

## 2017-12-14 DIAGNOSIS — M8000XA Age-related osteoporosis with current pathological fracture, unspecified site, initial encounter for fracture: Secondary | ICD-10-CM | POA: Diagnosis not present

## 2017-12-14 DIAGNOSIS — R768 Other specified abnormal immunological findings in serum: Secondary | ICD-10-CM | POA: Diagnosis not present

## 2017-12-14 DIAGNOSIS — I609 Nontraumatic subarachnoid hemorrhage, unspecified: Secondary | ICD-10-CM | POA: Diagnosis not present

## 2017-12-14 DIAGNOSIS — Z79899 Other long term (current) drug therapy: Secondary | ICD-10-CM | POA: Diagnosis not present

## 2017-12-14 DIAGNOSIS — E039 Hypothyroidism, unspecified: Secondary | ICD-10-CM | POA: Diagnosis not present

## 2017-12-14 DIAGNOSIS — Z0000 Encounter for general adult medical examination without abnormal findings: Secondary | ICD-10-CM | POA: Diagnosis not present

## 2017-12-14 DIAGNOSIS — Z23 Encounter for immunization: Secondary | ICD-10-CM | POA: Diagnosis not present

## 2017-12-14 DIAGNOSIS — N182 Chronic kidney disease, stage 2 (mild): Secondary | ICD-10-CM | POA: Diagnosis not present

## 2017-12-15 DIAGNOSIS — S4291XD Fracture of right shoulder girdle, part unspecified, subsequent encounter for fracture with routine healing: Secondary | ICD-10-CM | POA: Diagnosis not present

## 2017-12-20 DIAGNOSIS — M9901 Segmental and somatic dysfunction of cervical region: Secondary | ICD-10-CM | POA: Diagnosis not present

## 2017-12-20 DIAGNOSIS — M5030 Other cervical disc degeneration, unspecified cervical region: Secondary | ICD-10-CM | POA: Diagnosis not present

## 2017-12-20 DIAGNOSIS — M5413 Radiculopathy, cervicothoracic region: Secondary | ICD-10-CM | POA: Diagnosis not present

## 2017-12-20 DIAGNOSIS — M542 Cervicalgia: Secondary | ICD-10-CM | POA: Diagnosis not present

## 2017-12-27 DIAGNOSIS — M5030 Other cervical disc degeneration, unspecified cervical region: Secondary | ICD-10-CM | POA: Diagnosis not present

## 2017-12-27 DIAGNOSIS — M5413 Radiculopathy, cervicothoracic region: Secondary | ICD-10-CM | POA: Diagnosis not present

## 2017-12-27 DIAGNOSIS — M9901 Segmental and somatic dysfunction of cervical region: Secondary | ICD-10-CM | POA: Diagnosis not present

## 2017-12-27 DIAGNOSIS — M542 Cervicalgia: Secondary | ICD-10-CM | POA: Diagnosis not present

## 2017-12-29 DIAGNOSIS — M9901 Segmental and somatic dysfunction of cervical region: Secondary | ICD-10-CM | POA: Diagnosis not present

## 2017-12-29 DIAGNOSIS — M5030 Other cervical disc degeneration, unspecified cervical region: Secondary | ICD-10-CM | POA: Diagnosis not present

## 2017-12-29 DIAGNOSIS — M542 Cervicalgia: Secondary | ICD-10-CM | POA: Diagnosis not present

## 2017-12-29 DIAGNOSIS — M5413 Radiculopathy, cervicothoracic region: Secondary | ICD-10-CM | POA: Diagnosis not present

## 2018-01-03 DIAGNOSIS — M5413 Radiculopathy, cervicothoracic region: Secondary | ICD-10-CM | POA: Diagnosis not present

## 2018-01-03 DIAGNOSIS — M5030 Other cervical disc degeneration, unspecified cervical region: Secondary | ICD-10-CM | POA: Diagnosis not present

## 2018-01-03 DIAGNOSIS — M9901 Segmental and somatic dysfunction of cervical region: Secondary | ICD-10-CM | POA: Diagnosis not present

## 2018-01-03 DIAGNOSIS — M542 Cervicalgia: Secondary | ICD-10-CM | POA: Diagnosis not present

## 2018-01-05 DIAGNOSIS — M542 Cervicalgia: Secondary | ICD-10-CM | POA: Diagnosis not present

## 2018-01-05 DIAGNOSIS — M5030 Other cervical disc degeneration, unspecified cervical region: Secondary | ICD-10-CM | POA: Diagnosis not present

## 2018-01-05 DIAGNOSIS — M9901 Segmental and somatic dysfunction of cervical region: Secondary | ICD-10-CM | POA: Diagnosis not present

## 2018-01-05 DIAGNOSIS — M5413 Radiculopathy, cervicothoracic region: Secondary | ICD-10-CM | POA: Diagnosis not present

## 2018-01-17 DIAGNOSIS — M5413 Radiculopathy, cervicothoracic region: Secondary | ICD-10-CM | POA: Diagnosis not present

## 2018-01-17 DIAGNOSIS — M9901 Segmental and somatic dysfunction of cervical region: Secondary | ICD-10-CM | POA: Diagnosis not present

## 2018-01-17 DIAGNOSIS — M542 Cervicalgia: Secondary | ICD-10-CM | POA: Diagnosis not present

## 2018-01-17 DIAGNOSIS — M5030 Other cervical disc degeneration, unspecified cervical region: Secondary | ICD-10-CM | POA: Diagnosis not present

## 2018-01-31 DIAGNOSIS — M5413 Radiculopathy, cervicothoracic region: Secondary | ICD-10-CM | POA: Diagnosis not present

## 2018-01-31 DIAGNOSIS — M9901 Segmental and somatic dysfunction of cervical region: Secondary | ICD-10-CM | POA: Diagnosis not present

## 2018-01-31 DIAGNOSIS — M5030 Other cervical disc degeneration, unspecified cervical region: Secondary | ICD-10-CM | POA: Diagnosis not present

## 2018-01-31 DIAGNOSIS — M542 Cervicalgia: Secondary | ICD-10-CM | POA: Diagnosis not present

## 2018-02-06 DIAGNOSIS — R5383 Other fatigue: Secondary | ICD-10-CM | POA: Diagnosis not present

## 2018-02-06 DIAGNOSIS — R7989 Other specified abnormal findings of blood chemistry: Secondary | ICD-10-CM | POA: Diagnosis not present

## 2018-02-06 DIAGNOSIS — E559 Vitamin D deficiency, unspecified: Secondary | ICD-10-CM | POA: Diagnosis not present

## 2018-02-06 DIAGNOSIS — Z136 Encounter for screening for cardiovascular disorders: Secondary | ICD-10-CM | POA: Diagnosis not present

## 2018-02-06 DIAGNOSIS — E063 Autoimmune thyroiditis: Secondary | ICD-10-CM | POA: Diagnosis not present

## 2018-02-14 DIAGNOSIS — D72819 Decreased white blood cell count, unspecified: Secondary | ICD-10-CM | POA: Diagnosis not present

## 2018-02-16 DIAGNOSIS — M542 Cervicalgia: Secondary | ICD-10-CM | POA: Diagnosis not present

## 2018-02-16 DIAGNOSIS — M5030 Other cervical disc degeneration, unspecified cervical region: Secondary | ICD-10-CM | POA: Diagnosis not present

## 2018-02-16 DIAGNOSIS — M9901 Segmental and somatic dysfunction of cervical region: Secondary | ICD-10-CM | POA: Diagnosis not present

## 2018-02-16 DIAGNOSIS — M5413 Radiculopathy, cervicothoracic region: Secondary | ICD-10-CM | POA: Diagnosis not present

## 2018-03-02 DIAGNOSIS — M5413 Radiculopathy, cervicothoracic region: Secondary | ICD-10-CM | POA: Diagnosis not present

## 2018-03-02 DIAGNOSIS — M5030 Other cervical disc degeneration, unspecified cervical region: Secondary | ICD-10-CM | POA: Diagnosis not present

## 2018-03-02 DIAGNOSIS — M542 Cervicalgia: Secondary | ICD-10-CM | POA: Diagnosis not present

## 2018-03-02 DIAGNOSIS — M9901 Segmental and somatic dysfunction of cervical region: Secondary | ICD-10-CM | POA: Diagnosis not present

## 2018-03-16 DIAGNOSIS — M542 Cervicalgia: Secondary | ICD-10-CM | POA: Diagnosis not present

## 2018-03-16 DIAGNOSIS — M9901 Segmental and somatic dysfunction of cervical region: Secondary | ICD-10-CM | POA: Diagnosis not present

## 2018-03-16 DIAGNOSIS — M5413 Radiculopathy, cervicothoracic region: Secondary | ICD-10-CM | POA: Diagnosis not present

## 2018-03-16 DIAGNOSIS — M5030 Other cervical disc degeneration, unspecified cervical region: Secondary | ICD-10-CM | POA: Diagnosis not present

## 2018-03-17 DIAGNOSIS — M21961 Unspecified acquired deformity of right lower leg: Secondary | ICD-10-CM | POA: Diagnosis not present

## 2018-03-17 DIAGNOSIS — M21962 Unspecified acquired deformity of left lower leg: Secondary | ICD-10-CM | POA: Diagnosis not present

## 2018-03-30 DIAGNOSIS — M5030 Other cervical disc degeneration, unspecified cervical region: Secondary | ICD-10-CM | POA: Diagnosis not present

## 2018-03-30 DIAGNOSIS — M542 Cervicalgia: Secondary | ICD-10-CM | POA: Diagnosis not present

## 2018-03-30 DIAGNOSIS — M5413 Radiculopathy, cervicothoracic region: Secondary | ICD-10-CM | POA: Diagnosis not present

## 2018-03-30 DIAGNOSIS — M9901 Segmental and somatic dysfunction of cervical region: Secondary | ICD-10-CM | POA: Diagnosis not present

## 2018-04-03 DIAGNOSIS — E559 Vitamin D deficiency, unspecified: Secondary | ICD-10-CM | POA: Diagnosis not present

## 2018-04-03 DIAGNOSIS — R7989 Other specified abnormal findings of blood chemistry: Secondary | ICD-10-CM | POA: Diagnosis not present

## 2018-04-03 DIAGNOSIS — Z136 Encounter for screening for cardiovascular disorders: Secondary | ICD-10-CM | POA: Diagnosis not present

## 2018-04-03 DIAGNOSIS — R5383 Other fatigue: Secondary | ICD-10-CM | POA: Diagnosis not present

## 2018-04-03 DIAGNOSIS — E063 Autoimmune thyroiditis: Secondary | ICD-10-CM | POA: Diagnosis not present

## 2018-04-11 DIAGNOSIS — D72819 Decreased white blood cell count, unspecified: Secondary | ICD-10-CM | POA: Diagnosis not present

## 2018-12-15 IMAGING — CT CT SHOULDER*R* W/O CM
2 series · 10 of 14 positions shown, 12 images · non-contrast
Comparison: Radiographs 08/04/2017

CLINICAL DATA: Evaluate right shoulder fracture. Fell on [REDACTED]
08/04/2017. Preop for surgery.

EXAM:
CT OF THE UPPER RIGHT EXTREMITY WITHOUT CONTRAST
TECHNIQUE: Multidetector CT imaging of the upper right extremity was performed
according to the standard protocol.

[Series 2: soft tissue · axial · 0.41mm/px · z∈[+634,+746]mm · 5 of 84 slices shown, 7 images (1 of 2)]
[im 14/84  soft-tissue]
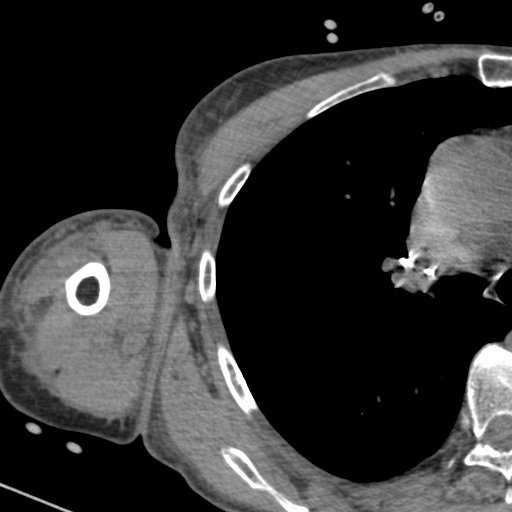
[im 14/84  bone]
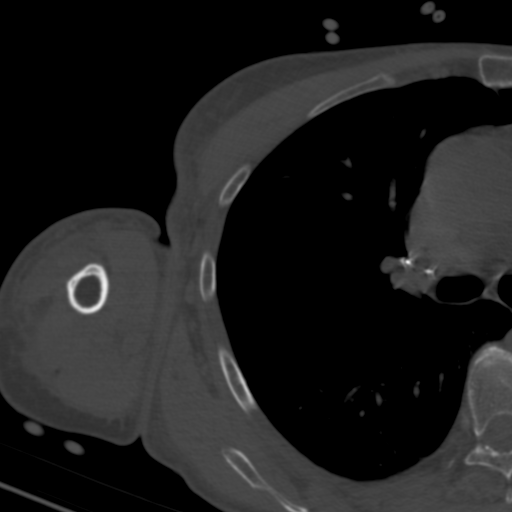
[im 28/84  bone]
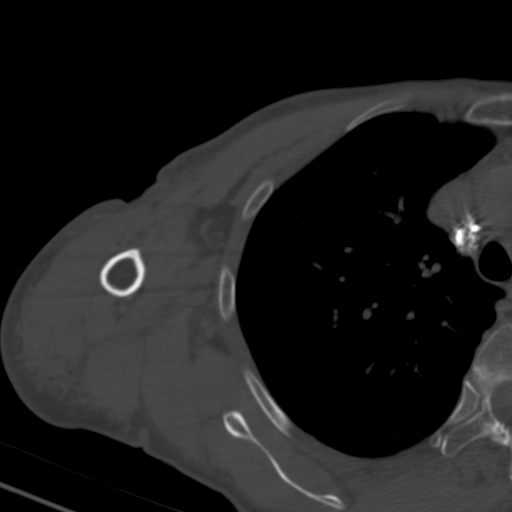
[im 42/84  bone]
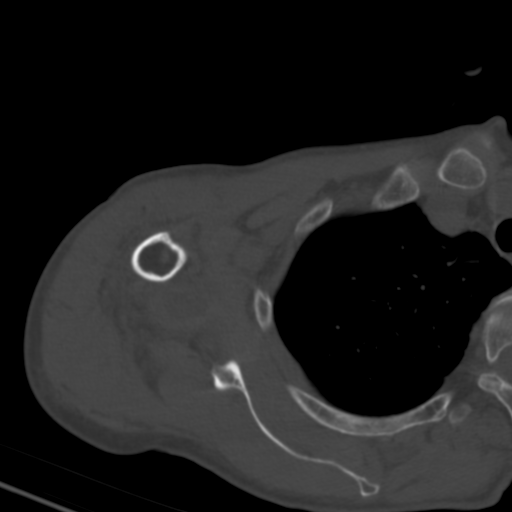
[im 56/84  bone]
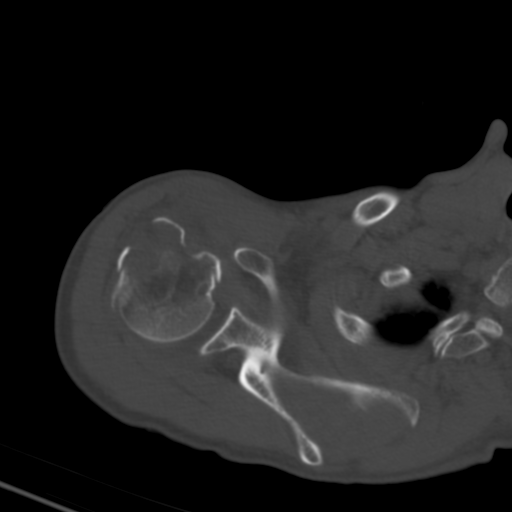
[im 70/84  soft-tissue]
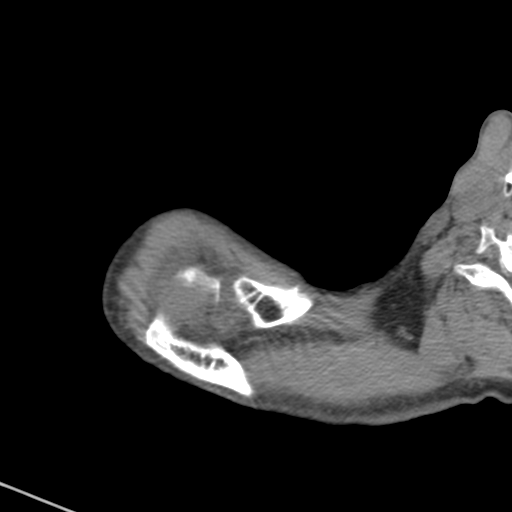
[im 70/84  bone]
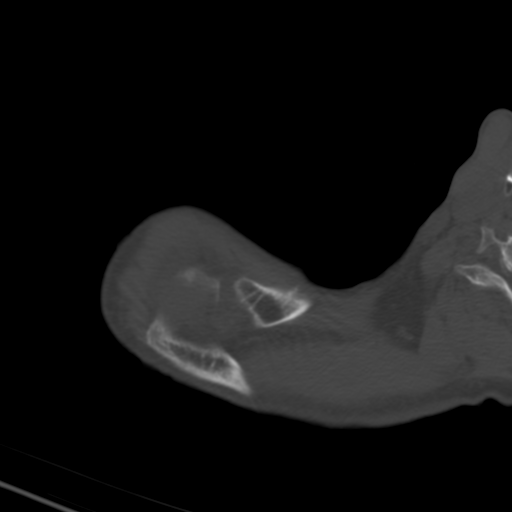

[Series 10: soft tissue · axial · 0.41mm/px · z∈[+634,+746]mm · 5 of 84 slices shown (2 of 2)]
[im 14/84  soft-tissue]
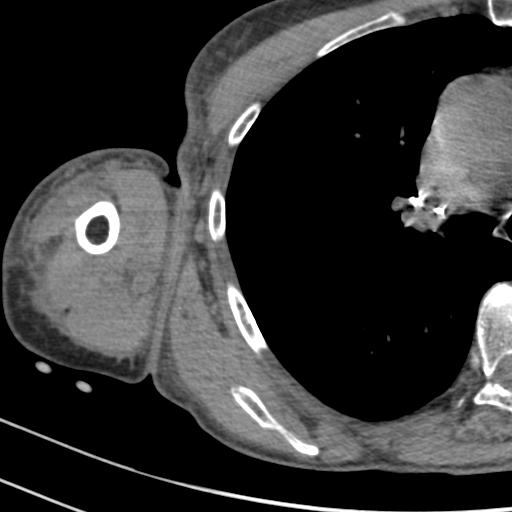
[im 28/84  soft-tissue]
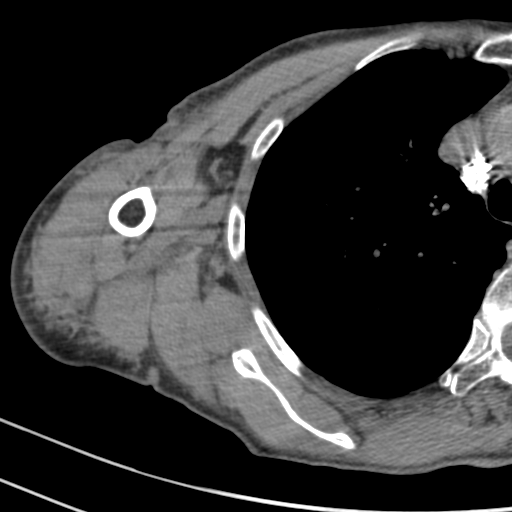
[im 42/84  soft-tissue]
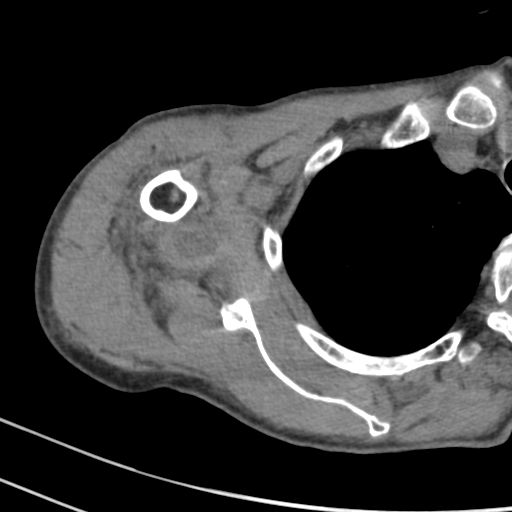
[im 56/84  soft-tissue]
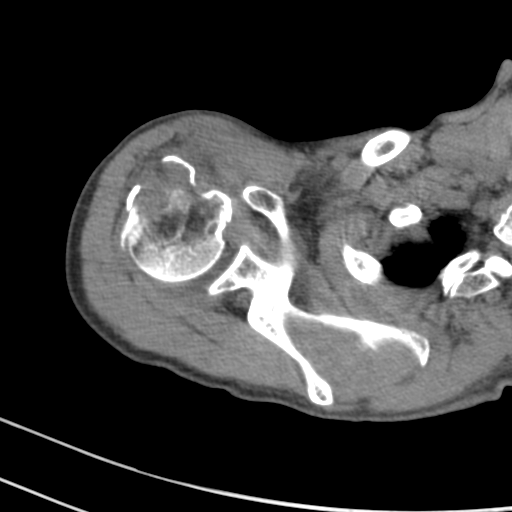
[im 70/84  soft-tissue]
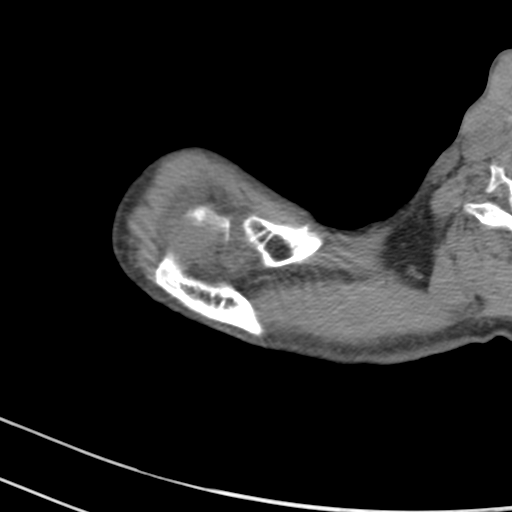

[10 of 14 positions shown; findings below may reference images not displayed]

FINDINGS: Complex comminuted and displaced humeral head and neck fractures.
There is significant medial and posterior rotation of the humeral
head likely due to the pull of the rotator cuff tendons.
Nondisplaced fracture through the greater tuberosity. The lesser
tuberosity is intact.

The glenoid is intact. No glenoid fracture. No significant
glenohumeral joint degenerative changes.

Moderate glenohumeral hemarthrosis.

The AC joint is intact.  Type 2 acromion.  No lateral downsloping.

The visualized ribs are intact and the visualized right lung is
grossly clear.
IMPRESSION: 1. Comminuted and displaced humeral head and neck fractures as
detailed above.
2. No fractures of the scapula, clavicle or ribs.
3. Grossly intact rotator cuff tendons.

## 2018-12-27 DIAGNOSIS — R7989 Other specified abnormal findings of blood chemistry: Secondary | ICD-10-CM | POA: Diagnosis not present

## 2018-12-27 DIAGNOSIS — E039 Hypothyroidism, unspecified: Secondary | ICD-10-CM | POA: Diagnosis not present

## 2018-12-27 DIAGNOSIS — N951 Menopausal and female climacteric states: Secondary | ICD-10-CM | POA: Diagnosis not present

## 2018-12-27 DIAGNOSIS — E559 Vitamin D deficiency, unspecified: Secondary | ICD-10-CM | POA: Diagnosis not present

## 2019-01-11 DIAGNOSIS — D72819 Decreased white blood cell count, unspecified: Secondary | ICD-10-CM | POA: Diagnosis not present

## 2019-02-02 DIAGNOSIS — Z23 Encounter for immunization: Secondary | ICD-10-CM | POA: Diagnosis not present

## 2019-02-02 DIAGNOSIS — I10 Essential (primary) hypertension: Secondary | ICD-10-CM | POA: Diagnosis not present

## 2019-02-02 DIAGNOSIS — Z0000 Encounter for general adult medical examination without abnormal findings: Secondary | ICD-10-CM | POA: Diagnosis not present

## 2019-02-02 DIAGNOSIS — D472 Monoclonal gammopathy: Secondary | ICD-10-CM | POA: Diagnosis not present

## 2019-02-02 DIAGNOSIS — E039 Hypothyroidism, unspecified: Secondary | ICD-10-CM | POA: Diagnosis not present

## 2019-02-02 DIAGNOSIS — M8000XA Age-related osteoporosis with current pathological fracture, unspecified site, initial encounter for fracture: Secondary | ICD-10-CM | POA: Diagnosis not present

## 2019-02-02 DIAGNOSIS — D692 Other nonthrombocytopenic purpura: Secondary | ICD-10-CM | POA: Diagnosis not present

## 2019-04-16 DIAGNOSIS — R5383 Other fatigue: Secondary | ICD-10-CM | POA: Diagnosis not present

## 2019-04-16 DIAGNOSIS — E559 Vitamin D deficiency, unspecified: Secondary | ICD-10-CM | POA: Diagnosis not present

## 2019-04-16 DIAGNOSIS — E039 Hypothyroidism, unspecified: Secondary | ICD-10-CM | POA: Diagnosis not present

## 2019-04-16 DIAGNOSIS — R7989 Other specified abnormal findings of blood chemistry: Secondary | ICD-10-CM | POA: Diagnosis not present

## 2019-04-26 DIAGNOSIS — Z23 Encounter for immunization: Secondary | ICD-10-CM | POA: Diagnosis not present

## 2019-05-03 DIAGNOSIS — D72819 Decreased white blood cell count, unspecified: Secondary | ICD-10-CM | POA: Diagnosis not present

## 2019-05-04 DIAGNOSIS — D72819 Decreased white blood cell count, unspecified: Secondary | ICD-10-CM | POA: Diagnosis not present

## 2019-05-22 DIAGNOSIS — Z23 Encounter for immunization: Secondary | ICD-10-CM | POA: Diagnosis not present

## 2019-08-01 DIAGNOSIS — R7989 Other specified abnormal findings of blood chemistry: Secondary | ICD-10-CM | POA: Diagnosis not present

## 2019-08-01 DIAGNOSIS — D72819 Decreased white blood cell count, unspecified: Secondary | ICD-10-CM | POA: Diagnosis not present

## 2019-08-01 DIAGNOSIS — E559 Vitamin D deficiency, unspecified: Secondary | ICD-10-CM | POA: Diagnosis not present

## 2019-08-01 DIAGNOSIS — R5383 Other fatigue: Secondary | ICD-10-CM | POA: Diagnosis not present

## 2019-08-13 DIAGNOSIS — D72819 Decreased white blood cell count, unspecified: Secondary | ICD-10-CM | POA: Diagnosis not present

## 2019-09-04 DIAGNOSIS — H25042 Posterior subcapsular polar age-related cataract, left eye: Secondary | ICD-10-CM | POA: Diagnosis not present

## 2019-09-04 DIAGNOSIS — H524 Presbyopia: Secondary | ICD-10-CM | POA: Diagnosis not present

## 2019-09-04 DIAGNOSIS — H5203 Hypermetropia, bilateral: Secondary | ICD-10-CM | POA: Diagnosis not present

## 2019-09-04 DIAGNOSIS — H2513 Age-related nuclear cataract, bilateral: Secondary | ICD-10-CM | POA: Diagnosis not present

## 2019-10-29 DIAGNOSIS — M542 Cervicalgia: Secondary | ICD-10-CM | POA: Diagnosis not present

## 2019-10-29 DIAGNOSIS — M5030 Other cervical disc degeneration, unspecified cervical region: Secondary | ICD-10-CM | POA: Diagnosis not present

## 2019-10-29 DIAGNOSIS — M9901 Segmental and somatic dysfunction of cervical region: Secondary | ICD-10-CM | POA: Diagnosis not present

## 2019-10-29 DIAGNOSIS — M5413 Radiculopathy, cervicothoracic region: Secondary | ICD-10-CM | POA: Diagnosis not present

## 2019-11-21 DIAGNOSIS — M9901 Segmental and somatic dysfunction of cervical region: Secondary | ICD-10-CM | POA: Diagnosis not present

## 2019-11-21 DIAGNOSIS — M5030 Other cervical disc degeneration, unspecified cervical region: Secondary | ICD-10-CM | POA: Diagnosis not present

## 2019-11-21 DIAGNOSIS — M5413 Radiculopathy, cervicothoracic region: Secondary | ICD-10-CM | POA: Diagnosis not present

## 2019-11-21 DIAGNOSIS — M542 Cervicalgia: Secondary | ICD-10-CM | POA: Diagnosis not present

## 2019-12-14 DIAGNOSIS — E559 Vitamin D deficiency, unspecified: Secondary | ICD-10-CM | POA: Diagnosis not present

## 2019-12-14 DIAGNOSIS — R5383 Other fatigue: Secondary | ICD-10-CM | POA: Diagnosis not present

## 2019-12-14 DIAGNOSIS — R7989 Other specified abnormal findings of blood chemistry: Secondary | ICD-10-CM | POA: Diagnosis not present

## 2019-12-14 DIAGNOSIS — E039 Hypothyroidism, unspecified: Secondary | ICD-10-CM | POA: Diagnosis not present

## 2020-01-01 DIAGNOSIS — Z23 Encounter for immunization: Secondary | ICD-10-CM | POA: Diagnosis not present

## 2020-01-01 DIAGNOSIS — N951 Menopausal and female climacteric states: Secondary | ICD-10-CM | POA: Diagnosis not present

## 2020-02-11 DIAGNOSIS — D692 Other nonthrombocytopenic purpura: Secondary | ICD-10-CM | POA: Diagnosis not present

## 2020-02-11 DIAGNOSIS — M8000XA Age-related osteoporosis with current pathological fracture, unspecified site, initial encounter for fracture: Secondary | ICD-10-CM | POA: Diagnosis not present

## 2020-02-11 DIAGNOSIS — I10 Essential (primary) hypertension: Secondary | ICD-10-CM | POA: Diagnosis not present

## 2020-02-11 DIAGNOSIS — Z1159 Encounter for screening for other viral diseases: Secondary | ICD-10-CM | POA: Diagnosis not present

## 2020-02-11 DIAGNOSIS — Z8679 Personal history of other diseases of the circulatory system: Secondary | ICD-10-CM | POA: Diagnosis not present

## 2020-02-11 DIAGNOSIS — D472 Monoclonal gammopathy: Secondary | ICD-10-CM | POA: Diagnosis not present

## 2020-02-11 DIAGNOSIS — R768 Other specified abnormal immunological findings in serum: Secondary | ICD-10-CM | POA: Diagnosis not present

## 2020-02-11 DIAGNOSIS — E039 Hypothyroidism, unspecified: Secondary | ICD-10-CM | POA: Diagnosis not present

## 2020-02-11 DIAGNOSIS — Z23 Encounter for immunization: Secondary | ICD-10-CM | POA: Diagnosis not present

## 2020-02-11 DIAGNOSIS — Z0000 Encounter for general adult medical examination without abnormal findings: Secondary | ICD-10-CM | POA: Diagnosis not present

## 2020-02-15 DIAGNOSIS — Z23 Encounter for immunization: Secondary | ICD-10-CM | POA: Diagnosis not present

## 2020-03-20 DIAGNOSIS — H2513 Age-related nuclear cataract, bilateral: Secondary | ICD-10-CM | POA: Diagnosis not present

## 2020-03-20 DIAGNOSIS — D72819 Decreased white blood cell count, unspecified: Secondary | ICD-10-CM | POA: Diagnosis not present

## 2020-07-21 DIAGNOSIS — R5383 Other fatigue: Secondary | ICD-10-CM | POA: Diagnosis not present

## 2020-07-21 DIAGNOSIS — E559 Vitamin D deficiency, unspecified: Secondary | ICD-10-CM | POA: Diagnosis not present

## 2020-07-21 DIAGNOSIS — E039 Hypothyroidism, unspecified: Secondary | ICD-10-CM | POA: Diagnosis not present

## 2020-07-21 DIAGNOSIS — R7989 Other specified abnormal findings of blood chemistry: Secondary | ICD-10-CM | POA: Diagnosis not present

## 2020-07-28 DIAGNOSIS — N951 Menopausal and female climacteric states: Secondary | ICD-10-CM | POA: Diagnosis not present

## 2020-08-11 DIAGNOSIS — Z23 Encounter for immunization: Secondary | ICD-10-CM | POA: Diagnosis not present

## 2020-10-26 DIAGNOSIS — N951 Menopausal and female climacteric states: Secondary | ICD-10-CM | POA: Diagnosis not present

## 2020-10-29 DIAGNOSIS — E559 Vitamin D deficiency, unspecified: Secondary | ICD-10-CM | POA: Diagnosis not present

## 2020-10-29 DIAGNOSIS — R5383 Other fatigue: Secondary | ICD-10-CM | POA: Diagnosis not present

## 2020-10-29 DIAGNOSIS — R7989 Other specified abnormal findings of blood chemistry: Secondary | ICD-10-CM | POA: Diagnosis not present

## 2020-10-29 DIAGNOSIS — E039 Hypothyroidism, unspecified: Secondary | ICD-10-CM | POA: Diagnosis not present

## 2020-12-26 DIAGNOSIS — Z23 Encounter for immunization: Secondary | ICD-10-CM | POA: Diagnosis not present

## 2021-01-12 DIAGNOSIS — Z23 Encounter for immunization: Secondary | ICD-10-CM | POA: Diagnosis not present

## 2021-01-30 DIAGNOSIS — E039 Hypothyroidism, unspecified: Secondary | ICD-10-CM | POA: Diagnosis not present

## 2021-01-30 DIAGNOSIS — R7989 Other specified abnormal findings of blood chemistry: Secondary | ICD-10-CM | POA: Diagnosis not present

## 2021-01-30 DIAGNOSIS — E559 Vitamin D deficiency, unspecified: Secondary | ICD-10-CM | POA: Diagnosis not present

## 2021-01-30 DIAGNOSIS — R5383 Other fatigue: Secondary | ICD-10-CM | POA: Diagnosis not present

## 2021-02-10 DIAGNOSIS — D72819 Decreased white blood cell count, unspecified: Secondary | ICD-10-CM | POA: Diagnosis not present

## 2021-02-17 DIAGNOSIS — Z0000 Encounter for general adult medical examination without abnormal findings: Secondary | ICD-10-CM | POA: Diagnosis not present

## 2021-02-17 DIAGNOSIS — M8000XA Age-related osteoporosis with current pathological fracture, unspecified site, initial encounter for fracture: Secondary | ICD-10-CM | POA: Diagnosis not present

## 2021-02-17 DIAGNOSIS — E039 Hypothyroidism, unspecified: Secondary | ICD-10-CM | POA: Diagnosis not present

## 2021-02-17 DIAGNOSIS — I10 Essential (primary) hypertension: Secondary | ICD-10-CM | POA: Diagnosis not present

## 2021-02-17 DIAGNOSIS — D472 Monoclonal gammopathy: Secondary | ICD-10-CM | POA: Diagnosis not present

## 2021-02-17 DIAGNOSIS — R768 Other specified abnormal immunological findings in serum: Secondary | ICD-10-CM | POA: Diagnosis not present

## 2021-02-17 DIAGNOSIS — M4185 Other forms of scoliosis, thoracolumbar region: Secondary | ICD-10-CM | POA: Diagnosis not present

## 2021-02-17 DIAGNOSIS — D692 Other nonthrombocytopenic purpura: Secondary | ICD-10-CM | POA: Diagnosis not present

## 2021-03-25 DIAGNOSIS — E039 Hypothyroidism, unspecified: Secondary | ICD-10-CM | POA: Diagnosis not present

## 2021-03-25 DIAGNOSIS — E559 Vitamin D deficiency, unspecified: Secondary | ICD-10-CM | POA: Diagnosis not present

## 2021-03-25 DIAGNOSIS — R7989 Other specified abnormal findings of blood chemistry: Secondary | ICD-10-CM | POA: Diagnosis not present

## 2021-03-31 DIAGNOSIS — D72819 Decreased white blood cell count, unspecified: Secondary | ICD-10-CM | POA: Diagnosis not present

## 2021-04-07 DIAGNOSIS — D72819 Decreased white blood cell count, unspecified: Secondary | ICD-10-CM | POA: Diagnosis not present

## 2021-05-11 DIAGNOSIS — H269 Unspecified cataract: Secondary | ICD-10-CM | POA: Diagnosis not present

## 2021-05-20 DIAGNOSIS — H269 Unspecified cataract: Secondary | ICD-10-CM | POA: Diagnosis not present

## 2021-05-21 DIAGNOSIS — Z20822 Contact with and (suspected) exposure to covid-19: Secondary | ICD-10-CM | POA: Diagnosis not present

## 2021-06-03 DIAGNOSIS — Z20822 Contact with and (suspected) exposure to covid-19: Secondary | ICD-10-CM | POA: Diagnosis not present

## 2021-06-10 DIAGNOSIS — Z20822 Contact with and (suspected) exposure to covid-19: Secondary | ICD-10-CM | POA: Diagnosis not present

## 2021-07-08 DIAGNOSIS — H269 Unspecified cataract: Secondary | ICD-10-CM | POA: Diagnosis not present

## 2021-07-20 DIAGNOSIS — H269 Unspecified cataract: Secondary | ICD-10-CM | POA: Diagnosis not present

## 2021-07-21 DIAGNOSIS — Z20822 Contact with and (suspected) exposure to covid-19: Secondary | ICD-10-CM | POA: Diagnosis not present

## 2021-07-28 DIAGNOSIS — Z20822 Contact with and (suspected) exposure to covid-19: Secondary | ICD-10-CM | POA: Diagnosis not present

## 2021-08-14 DIAGNOSIS — Z23 Encounter for immunization: Secondary | ICD-10-CM | POA: Diagnosis not present

## 2021-08-15 DIAGNOSIS — Z20822 Contact with and (suspected) exposure to covid-19: Secondary | ICD-10-CM | POA: Diagnosis not present

## 2021-08-17 DIAGNOSIS — M21961 Unspecified acquired deformity of right lower leg: Secondary | ICD-10-CM | POA: Diagnosis not present

## 2021-08-17 DIAGNOSIS — M21962 Unspecified acquired deformity of left lower leg: Secondary | ICD-10-CM | POA: Diagnosis not present

## 2021-08-17 DIAGNOSIS — R262 Difficulty in walking, not elsewhere classified: Secondary | ICD-10-CM | POA: Diagnosis not present

## 2021-08-17 DIAGNOSIS — M21622 Bunionette of left foot: Secondary | ICD-10-CM | POA: Diagnosis not present

## 2021-08-17 DIAGNOSIS — M21621 Bunionette of right foot: Secondary | ICD-10-CM | POA: Diagnosis not present

## 2021-09-30 DIAGNOSIS — D72819 Decreased white blood cell count, unspecified: Secondary | ICD-10-CM | POA: Diagnosis not present

## 2021-09-30 DIAGNOSIS — E559 Vitamin D deficiency, unspecified: Secondary | ICD-10-CM | POA: Diagnosis not present

## 2021-09-30 DIAGNOSIS — R7989 Other specified abnormal findings of blood chemistry: Secondary | ICD-10-CM | POA: Diagnosis not present

## 2021-09-30 DIAGNOSIS — E039 Hypothyroidism, unspecified: Secondary | ICD-10-CM | POA: Diagnosis not present

## 2021-10-05 DIAGNOSIS — D72819 Decreased white blood cell count, unspecified: Secondary | ICD-10-CM | POA: Diagnosis not present

## 2021-10-09 DIAGNOSIS — M21621 Bunionette of right foot: Secondary | ICD-10-CM | POA: Diagnosis not present

## 2021-10-09 DIAGNOSIS — M21961 Unspecified acquired deformity of right lower leg: Secondary | ICD-10-CM | POA: Diagnosis not present

## 2021-10-09 DIAGNOSIS — M21962 Unspecified acquired deformity of left lower leg: Secondary | ICD-10-CM | POA: Diagnosis not present

## 2021-10-09 DIAGNOSIS — M21622 Bunionette of left foot: Secondary | ICD-10-CM | POA: Diagnosis not present

## 2021-12-10 ENCOUNTER — Encounter (HOSPITAL_BASED_OUTPATIENT_CLINIC_OR_DEPARTMENT_OTHER): Payer: Self-pay | Admitting: Emergency Medicine

## 2021-12-10 ENCOUNTER — Emergency Department (HOSPITAL_BASED_OUTPATIENT_CLINIC_OR_DEPARTMENT_OTHER)
Admission: EM | Admit: 2021-12-10 | Discharge: 2021-12-10 | Disposition: A | Discharge status: Home / Self Care | Payer: Medicare Other | Attending: Emergency Medicine | Admitting: Emergency Medicine

## 2021-12-10 ENCOUNTER — Emergency Department (HOSPITAL_BASED_OUTPATIENT_CLINIC_OR_DEPARTMENT_OTHER): Payer: Medicare Other | Admitting: Radiology

## 2021-12-10 ENCOUNTER — Other Ambulatory Visit: Payer: Self-pay

## 2021-12-10 ENCOUNTER — Emergency Department (HOSPITAL_BASED_OUTPATIENT_CLINIC_OR_DEPARTMENT_OTHER): Payer: Medicare Other

## 2021-12-10 DIAGNOSIS — R0781 Pleurodynia: Secondary | ICD-10-CM | POA: Diagnosis not present

## 2021-12-10 DIAGNOSIS — S79922A Unspecified injury of left thigh, initial encounter: Secondary | ICD-10-CM | POA: Diagnosis present

## 2021-12-10 DIAGNOSIS — Y92002 Bathroom of unspecified non-institutional (private) residence single-family (private) house as the place of occurrence of the external cause: Secondary | ICD-10-CM | POA: Insufficient documentation

## 2021-12-10 DIAGNOSIS — M25552 Pain in left hip: Secondary | ICD-10-CM | POA: Insufficient documentation

## 2021-12-10 DIAGNOSIS — R930 Abnormal findings on diagnostic imaging of skull and head, not elsewhere classified: Secondary | ICD-10-CM | POA: Insufficient documentation

## 2021-12-10 DIAGNOSIS — W182XXA Fall in (into) shower or empty bathtub, initial encounter: Secondary | ICD-10-CM | POA: Diagnosis not present

## 2021-12-10 DIAGNOSIS — M25422 Effusion, left elbow: Secondary | ICD-10-CM | POA: Diagnosis not present

## 2021-12-10 DIAGNOSIS — G9689 Other specified disorders of central nervous system: Secondary | ICD-10-CM | POA: Diagnosis not present

## 2021-12-10 DIAGNOSIS — Z7982 Long term (current) use of aspirin: Secondary | ICD-10-CM | POA: Insufficient documentation

## 2021-12-10 DIAGNOSIS — S7012XA Contusion of left thigh, initial encounter: Secondary | ICD-10-CM | POA: Insufficient documentation

## 2021-12-10 DIAGNOSIS — M25522 Pain in left elbow: Secondary | ICD-10-CM | POA: Diagnosis not present

## 2021-12-10 LAB — CBC WITH DIFFERENTIAL/PLATELET
Abs Immature Granulocytes: 0.04 10*3/uL (ref 0.00–0.07)
Basophils Absolute: 0 10*3/uL (ref 0.0–0.1)
Basophils Relative: 0 %
Eosinophils Absolute: 0 10*3/uL (ref 0.0–0.5)
Eosinophils Relative: 0 %
HCT: 38.9 % (ref 36.0–46.0)
Hemoglobin: 13.6 g/dL (ref 12.0–15.0)
Immature Granulocytes: 0 %
Lymphocytes Relative: 5 %
Lymphs Abs: 0.6 10*3/uL — ABNORMAL LOW (ref 0.7–4.0)
MCH: 33.8 pg (ref 26.0–34.0)
MCHC: 35 g/dL (ref 30.0–36.0)
MCV: 96.8 fL (ref 80.0–100.0)
Monocytes Absolute: 0.6 10*3/uL (ref 0.1–1.0)
Monocytes Relative: 6 %
Neutro Abs: 9.8 10*3/uL — ABNORMAL HIGH (ref 1.7–7.7)
Neutrophils Relative %: 89 %
Platelets: 295 10*3/uL (ref 150–400)
RBC: 4.02 MIL/uL (ref 3.87–5.11)
RDW: 12.5 % (ref 11.5–15.5)
WBC: 11 10*3/uL — ABNORMAL HIGH (ref 4.0–10.5)
nRBC: 0 % (ref 0.0–0.2)

## 2021-12-10 LAB — BASIC METABOLIC PANEL
Anion gap: 12 (ref 5–15)
BUN: 15 mg/dL (ref 8–23)
CO2: 25 mmol/L (ref 22–32)
Calcium: 9.7 mg/dL (ref 8.9–10.3)
Chloride: 90 mmol/L — ABNORMAL LOW (ref 98–111)
Creatinine, Ser: 0.98 mg/dL (ref 0.44–1.00)
GFR, Estimated: >60 mL/min (ref >60)
Glucose, Bld: 145 mg/dL — ABNORMAL HIGH (ref 70–99)
Potassium: 3.5 mmol/L (ref 3.5–5.1)
Sodium: 127 mmol/L — ABNORMAL LOW (ref 135–145)

## 2021-12-10 MED ORDER — SODIUM CHLORIDE 0.9 % IV BOLUS
1000.0000 mL | Freq: Once | INTRAVENOUS | Status: AC
  Administered 2021-12-10: 1000 mL via INTRAVENOUS

## 2021-12-10 NOTE — ED Triage Notes (Signed)
Pt arrives to ED with c/o fall. Pt reports she fainted and fell to the floor yesterday. She reports left elbow and hip pain. She reports walking and doing yoga before the fainting spell. She reports her heart starting racing before the fainting spell shortly after standing up quick. Associated symptoms include abdominal cramping, diarrhea, and lightheadedness. Hx falls.

## 2021-12-10 NOTE — Discharge Instructions (Signed)
Overall follow-up with Dr. Percell Miller.  Do not get your splint wet.  You have a small effusion in your left elbow and overall suspect that you have a contusion but there is a small possibility that you might have a small fracture there.  Follow-up with orthopedics to have that reimaged in about 7 days.  Recommend ice and Tylenol.

## 2021-12-10 NOTE — ED Provider Notes (Signed)
[facility] EMERGENCY DEPT Provider Note   CSN: [id number] Arrival date & time: 12/10/21  1028     History  Chief Complaint  Patient presents with   [name] Ruiz is a 76 y.o. female.  Patient here with left elbow pain and left hip pain after fall yesterday.  She was having some diarrhea and nausea yesterday.  She after standing up quickly off of her bed and tried to go to the bathroom got lightheaded and fell to the floor.  She is not sure if she lost consciousness or hit her head.  She is feeling a lot better today.  When she woke up however she still having some left elbow pain and left hip pain.  Has been able to ambulate.  Denies any chest pain or shortness of breath.  Nothing makes it worse or better.  No significant medical history.  Lives by herself.  The history is provided by the patient.       Home Medications Prior to Admission medications   Medication Sig Start Date End Date Taking? Authorizing Provider  acetaminophen (TYLENOL) 500 MG tablet Take 500 mg by mouth every 6 (six) hours as needed for moderate pain or headache.     [provider]  ARMOUR THYROID 15 MG tablet Take 15 mg by mouth once daily 09/09/15   [provider]  aspirin 325 MG tablet Take 325 mg by mouth daily.    [provider]  b complex vitamins tablet Take 1 tablet by mouth daily.    [provider]  Azucena Freed Serrata (BOSWELLIA PO) Take 1 capsule by mouth 2 (two) times daily.    [provider]  cholecalciferol (VITAMIN D) 1000 units tablet Take 4,000 Units by mouth 2 (two) times daily.     [provider]  DIGESTIVE ENZYMES PO Take 1 capsule by mouth 3 (three) times daily.     [provider]  EVENING PRIMROSE OIL PO Take 1 capsule by mouth 2 (two) times daily.    [provider]  Liniments (DEEP BLUE RELIEF EX) Apply 1 application topically 3 (three) times daily as needed (for pain).    [provider]  Magnesium 500 MG TABS Take 500 mg by mouth 2 (two) times daily.     [provider]  methocarbamol (ROBAXIN) 500 MG tablet Take 1 tablet (500 mg total) by mouth 3 (three) times daily as needed. 08/15/17   Netta Cedars, MD  Misc Natural Products (TART CHERRY ADVANCED PO) Take 1 capsule by mouth 3 (three) times daily.    [provider]  Omega-3 Fatty Acids (FISH OIL PO) Take 1 capsule by mouth 3 (three) times daily.    [provider]  OVER THE COUNTER MEDICATION Apply 1 application topically 3 (three) times daily as needed (for pain). Super White Stuff Rub    [provider]  oxyCODONE-acetaminophen (ROXICET) 5-325 MG tablet Take 1-2 tablets by mouth every 4 (four) hours as needed for severe pain. Patient taking differently: Take 0.25-0.5 tablets by mouth every 4 (four) hours as needed for severe pain.  12/10/15   Netta Cedars, MD  Selenium 200 MCG CAPS Take 200 mcg by mouth daily.     [provider]  sodium chloride (OCEAN) 0.65 % SOLN nasal spray Place 2-3 sprays into both nostrils as needed for congestion.    [provider]      Allergies    Astemizole, Bactrim [sulfamethoxazole-trimethoprim], Monosodium glutamate,  Benadryl [diphenhydramine], Cortisone, and Seldane [terfenadine]    Review of Systems   Review of Systems  Physical Exam Updated Vital Signs BP (!) 166/96 (BP Location: Right Arm)   Pulse 90   Temp 99 F (37.2 C) (Oral)   Resp 16   Ht 5' (1.524 m)   Wt 45.4 kg   SpO2 100%   BMI 19.53 kg/m  Physical Exam Vitals and nursing note reviewed.  Constitutional:      General: She is not in acute distress.    Appearance: She is well-developed. She is not ill-appearing.  HENT:     Head: Normocephalic and atraumatic.     Nose: Nose normal.     Mouth/Throat:     Mouth: Mucous membranes are moist.  Eyes:     Extraocular Movements: Extraocular movements intact.     Conjunctiva/sclera: Conjunctivae normal.      Pupils: Pupils are equal, round, and reactive to light.  Cardiovascular:     Rate and Rhythm: Normal rate and regular rhythm.     Pulses: Normal pulses.     Heart sounds: Normal heart sounds. No murmur heard. Pulmonary:     Effort: Pulmonary effort is normal. No respiratory distress.     Breath sounds: Normal breath sounds.  Abdominal:     Palpations: Abdomen is soft.     Tenderness: There is no abdominal tenderness.  Musculoskeletal:        General: Tenderness present. No swelling.     Cervical back: Neck supple.     Comments: Tenderness to the left hip and left elbow  Skin:    General: Skin is warm and dry.     Capillary Refill: Capillary refill takes less than 2 seconds.  Neurological:     General: No focal deficit present.     Mental Status: She is alert and oriented to person, place, and time.     Cranial Nerves: No cranial nerve deficit.     Sensory: No sensory deficit.     Motor: No weakness.     Coordination: Coordination normal.     Comments: 5+ out of 5 strength throughout, normal sensation, normal speech  Psychiatric:        Mood and Affect: Mood normal.     ED Results / Procedures / Treatments   Labs (all labs ordered are listed, but only abnormal results are displayed) Labs Reviewed  CBC WITH DIFFERENTIAL/PLATELET - Abnormal; Notable for the following components:      Result Value   WBC 11.0 (*)    Neutro Abs 9.8 (*)    Lymphs Abs 0.6 (*)    All other components within normal limits  BASIC METABOLIC PANEL - Abnormal; Notable for the following components:   Sodium 127 (*)    Chloride 90 (*)    Glucose, Bld 145 (*)    All other components within normal limits    EKG None  Radiology DG Ribs Unilateral W/Chest Right  Result Date: 12/10/2021 CLINICAL DATA:  Posterior right rib pain after falling. EXAM: RIGHT RIBS AND CHEST - 3+ VIEW COMPARISON:  Chest radiographs 07/31/2010. FINDINGS: The heart size and mediastinal contours are stable with aortic  atherosclerosis and calcified right paratracheal lymph nodes. The lungs are clear. There is no pleural effusion or pneumothorax. No displaced acute right-sided rib fractures are seen. There is a moderate thoracolumbar spondylosis with associated multilevel spondylosis. There are postsurgical changes in both humeri. IMPRESSION: 1. No evidence of acute rib fracture, pleural effusion or pneumothorax. 2.  Sequela of prior granulomatous disease and thoracolumbar scoliosis again noted. Electronically Signed   By: Richardean Sale M.D.   On: 12/10/2021 11:29   DG Elbow Complete Left  Result Date: 12/10/2021 CLINICAL DATA:  Posterior elbow pain status post fall. EXAM: LEFT ELBOW - COMPLETE 3+ VIEW COMPARISON:  None Available. FINDINGS: The bones are demineralized. There is an elbow joint effusion manifesting as an up lifted anterior fat pad. No displaced acute fracture demonstrated. There is no dislocation. Mild soft tissue swelling over the olecranon process. No evidence of foreign body. IMPRESSION: 1. Joint effusion in the setting of trauma suspicious for an occult fracture, most commonly of the radial head in this age group. Suggest immobilization and radiographic follow-up. 2. No displaced fracture or dislocation. Electronically Signed   By: Richardean Sale M.D.   On: 12/10/2021 11:27   DG Hip Unilat With Pelvis 2-3 Views Left  Result Date: 12/10/2021 CLINICAL DATA:  Posterior left hip pain after falling. EXAM: DG HIP (WITH OR WITHOUT PELVIS) 2-3V LEFT COMPARISON:  None Available. FINDINGS: The bones appear demineralized. There is no evidence of acute fracture, dislocation or femoral head osteonecrosis. Probable remote posttraumatic deformity of the right parasymphyseal region. There are mild degenerative changes of the hips and sacroiliac joints bilaterally. Calcified uterine fibroid and lower lumbar spondylosis are noted. There is some soft tissue edema laterally in the left proximal thigh without evidence of  foreign body. IMPRESSION: 1. No evidence of acute fracture or dislocation. 2. Soft tissue swelling laterally in the proximal left thigh. Electronically Signed   By: Richardean Sale M.D.   On: 12/10/2021 11:25   CT Head Wo Contrast  Result Date: 12/10/2021 CLINICAL DATA:  [place] yesterday. EXAM: CT HEAD WITHOUT CONTRAST TECHNIQUE: Contiguous axial images were obtained from the base of the skull through the vertex without intravenous contrast. RADIATION DOSE REDUCTION: This exam was performed according to the departmental dose-optimization program which includes automated exposure control, adjustment of the mA and/or kV according to patient size and/or use of iterative reconstruction technique. COMPARISON:  Spine FINDINGS: Brain: No evidence of acute infarction, hemorrhage, hydrocephalus, extra-axial collection or mass lesion/mass effect. Mild generalized cerebral atrophy, within normal limits for age. Scattered mild periventricular and subcortical white matter hypodensities are nonspecific, but favored to reflect chronic microvascular ischemic changes. Vascular: Calcified atherosclerosis at the skull base. No hyperdense vessel. Skull: Normal. Negative for fracture or focal lesion. Sinuses/Orbits: No acute finding. Other: None. IMPRESSION: 1. No acute intracranial abnormality. 2. Mild chronic microvascular ischemic changes. Electronically Signed   By: Titus Dubin M.D.   On: 12/10/2021 11:09    Procedures Procedures    Medications Ordered in ED Medications  sodium chloride 0.9 % bolus 1,000 mL (1,000 mLs Intravenous New Bag/Given 12/10/21 1050)    ED Course/ Medical Decision Making/ A&P                           Medical Decision Making Amount and/or Complexity of Data Reviewed Labs: ordered. Radiology: ordered.   Reather Laurence is here with left elbow pain and left hip pain after a near syncopal event yesterday.  Normal vitals.  No fever.  EKG per my review and interpretation shows sinus  rhythm.  No ischemic changes.  She is tender to the left elbow and left hip.  Neurologically she is intact.  She is having some nausea vomiting diarrhea yesterday afternoon.  When she stood up suddenly from her bed to go  to the bathroom again she got lightheaded and dizzy and fell to the floor.  Does not think she lost consciousness.  Not sure if she hit her head.  She felt fine the rest of the day and went to bed early.  Woke up this morning and still was having elbow pain hip pain but she is been ambulatory.  Differential diagnosis is likely contusion.  But will get x-rays of the elbow and the hip to evaluate for fracture.  We will get a head CT given maybe she is hit her head.  Overall sounds like a vasovagal/orthostatic type syncopal/near syncope event.  We will check basic labs including CBC and BMP and give fluid bolus.  Overall she appears well.  Is not having any chest pain or shortness of breath or any other concerning symptoms.  Per my review and interpretation of labs is no significant anemia or leukocytosis.  Sodium is 127.  History of low sodiums in the past.  She states she is typically between 125 and 135.  Overall she is asymptomatic from the standpoint.  We will have her follow-up with her primary care doctor.  Lab work otherwise unremarkable.  EKG shows sinus rhythm.  No ischemic changes.  X-rays of the hip show no fracture but there is likely contusion.  Chest x-ray per my review and interpretation shows no evidence of pneumonia or pneumothorax.  CT scan of the head is unremarkable.  X-ray of the left elbow with joint effusion and may be an occult fracture.  Will place in a splint and have her follow-up with orthopedics.  Discharged in good condition.  This chart was dictated using voice recognition software.  Despite best efforts to proofread,  errors can occur which can change the documentation meaning.         Final Clinical Impression(s) / ED Diagnoses Final diagnoses:  Elbow  effusion, left  Contusion of left thigh, initial encounter    Rx / DC Orders ED Discharge Orders     None         Lennice Sites, DO 12/10/21 1150

## 2021-12-10 NOTE — ED Notes (Signed)
Patient transported to CT 

## 2021-12-23 DIAGNOSIS — S5002XA Contusion of left elbow, initial encounter: Secondary | ICD-10-CM | POA: Diagnosis not present

## 2021-12-23 DIAGNOSIS — M25522 Pain in left elbow: Secondary | ICD-10-CM | POA: Diagnosis not present

## 2022-01-07 DIAGNOSIS — Z23 Encounter for immunization: Secondary | ICD-10-CM | POA: Diagnosis not present

## 2022-01-20 DIAGNOSIS — E559 Vitamin D deficiency, unspecified: Secondary | ICD-10-CM | POA: Diagnosis not present

## 2022-01-20 DIAGNOSIS — D72819 Decreased white blood cell count, unspecified: Secondary | ICD-10-CM | POA: Diagnosis not present

## 2022-01-20 DIAGNOSIS — R7989 Other specified abnormal findings of blood chemistry: Secondary | ICD-10-CM | POA: Diagnosis not present

## 2022-01-20 DIAGNOSIS — E039 Hypothyroidism, unspecified: Secondary | ICD-10-CM | POA: Diagnosis not present

## 2022-01-26 DIAGNOSIS — D72819 Decreased white blood cell count, unspecified: Secondary | ICD-10-CM | POA: Diagnosis not present

## 2022-02-16 DIAGNOSIS — Z23 Encounter for immunization: Secondary | ICD-10-CM | POA: Diagnosis not present

## 2022-03-09 DIAGNOSIS — E039 Hypothyroidism, unspecified: Secondary | ICD-10-CM | POA: Diagnosis not present

## 2022-03-09 DIAGNOSIS — Z0000 Encounter for general adult medical examination without abnormal findings: Secondary | ICD-10-CM | POA: Diagnosis not present

## 2022-03-09 DIAGNOSIS — R768 Other specified abnormal immunological findings in serum: Secondary | ICD-10-CM | POA: Diagnosis not present

## 2022-03-09 DIAGNOSIS — M4185 Other forms of scoliosis, thoracolumbar region: Secondary | ICD-10-CM | POA: Diagnosis not present

## 2022-03-09 DIAGNOSIS — I10 Essential (primary) hypertension: Secondary | ICD-10-CM | POA: Diagnosis not present

## 2022-03-09 DIAGNOSIS — M8000XA Age-related osteoporosis with current pathological fracture, unspecified site, initial encounter for fracture: Secondary | ICD-10-CM | POA: Diagnosis not present

## 2022-03-09 DIAGNOSIS — D692 Other nonthrombocytopenic purpura: Secondary | ICD-10-CM | POA: Diagnosis not present

## 2022-03-09 DIAGNOSIS — D472 Monoclonal gammopathy: Secondary | ICD-10-CM | POA: Diagnosis not present

## 2022-03-30 DIAGNOSIS — D72819 Decreased white blood cell count, unspecified: Secondary | ICD-10-CM | POA: Diagnosis not present

## 2022-09-22 DIAGNOSIS — R7989 Other specified abnormal findings of blood chemistry: Secondary | ICD-10-CM | POA: Diagnosis not present

## 2022-09-22 DIAGNOSIS — E559 Vitamin D deficiency, unspecified: Secondary | ICD-10-CM | POA: Diagnosis not present

## 2022-09-22 DIAGNOSIS — R5383 Other fatigue: Secondary | ICD-10-CM | POA: Diagnosis not present

## 2022-09-22 DIAGNOSIS — D72819 Decreased white blood cell count, unspecified: Secondary | ICD-10-CM | POA: Diagnosis not present

## 2022-09-22 DIAGNOSIS — E039 Hypothyroidism, unspecified: Secondary | ICD-10-CM | POA: Diagnosis not present

## 2022-09-28 DIAGNOSIS — D72819 Decreased white blood cell count, unspecified: Secondary | ICD-10-CM | POA: Diagnosis not present

## 2023-01-06 DIAGNOSIS — Z23 Encounter for immunization: Secondary | ICD-10-CM | POA: Diagnosis not present

## 2023-02-02 DIAGNOSIS — R5383 Other fatigue: Secondary | ICD-10-CM | POA: Diagnosis not present

## 2023-02-02 DIAGNOSIS — D72819 Decreased white blood cell count, unspecified: Secondary | ICD-10-CM | POA: Diagnosis not present

## 2023-02-02 DIAGNOSIS — R7989 Other specified abnormal findings of blood chemistry: Secondary | ICD-10-CM | POA: Diagnosis not present

## 2023-02-08 DIAGNOSIS — D72819 Decreased white blood cell count, unspecified: Secondary | ICD-10-CM | POA: Diagnosis not present

## 2023-02-14 DIAGNOSIS — Z23 Encounter for immunization: Secondary | ICD-10-CM | POA: Diagnosis not present

## 2023-03-15 DIAGNOSIS — T7840XA Allergy, unspecified, initial encounter: Secondary | ICD-10-CM | POA: Diagnosis not present

## 2023-03-15 DIAGNOSIS — Z0000 Encounter for general adult medical examination without abnormal findings: Secondary | ICD-10-CM | POA: Diagnosis not present

## 2023-03-15 DIAGNOSIS — E039 Hypothyroidism, unspecified: Secondary | ICD-10-CM | POA: Diagnosis not present

## 2023-03-15 DIAGNOSIS — Z23 Encounter for immunization: Secondary | ICD-10-CM | POA: Diagnosis not present

## 2023-03-15 DIAGNOSIS — L853 Xerosis cutis: Secondary | ICD-10-CM | POA: Diagnosis not present

## 2023-03-21 DIAGNOSIS — D72819 Decreased white blood cell count, unspecified: Secondary | ICD-10-CM | POA: Diagnosis not present

## 2023-03-26 ENCOUNTER — Emergency Department (HOSPITAL_BASED_OUTPATIENT_CLINIC_OR_DEPARTMENT_OTHER)
Admission: EM | Admit: 2023-03-26 | Discharge: 2023-03-26 | Disposition: A | Discharge status: Home / Self Care | Payer: Medicare Other | Attending: Emergency Medicine | Admitting: Emergency Medicine

## 2023-03-26 ENCOUNTER — Other Ambulatory Visit: Payer: Self-pay

## 2023-03-26 ENCOUNTER — Encounter (HOSPITAL_BASED_OUTPATIENT_CLINIC_OR_DEPARTMENT_OTHER): Payer: Self-pay

## 2023-03-26 ENCOUNTER — Emergency Department (HOSPITAL_BASED_OUTPATIENT_CLINIC_OR_DEPARTMENT_OTHER): Payer: Medicare Other

## 2023-03-26 DIAGNOSIS — S8002XA Contusion of left knee, initial encounter: Secondary | ICD-10-CM | POA: Insufficient documentation

## 2023-03-26 DIAGNOSIS — W010XXA Fall on same level from slipping, tripping and stumbling without subsequent striking against object, initial encounter: Secondary | ICD-10-CM | POA: Diagnosis not present

## 2023-03-26 DIAGNOSIS — M25562 Pain in left knee: Secondary | ICD-10-CM | POA: Diagnosis not present

## 2023-03-26 DIAGNOSIS — S8992XA Unspecified injury of left lower leg, initial encounter: Secondary | ICD-10-CM | POA: Diagnosis present

## 2023-03-26 DIAGNOSIS — E039 Hypothyroidism, unspecified: Secondary | ICD-10-CM | POA: Diagnosis not present

## 2023-03-26 DIAGNOSIS — Z7982 Long term (current) use of aspirin: Secondary | ICD-10-CM | POA: Diagnosis not present

## 2023-03-26 NOTE — Discharge Instructions (Signed)
As discussed, your imaging is reassuring. Continue icing your knee at home and taking Tylenol as needed. Wear the ACE wrap as needed for pain and swelling.  Follow up with your PCP in the next several days for reevaluation of your symptoms.  Get help right away if: Your knee swells, and the swelling gets worse. You cannot move your knee. You have very bad knee pain that does not get better with pain medicine.

## 2023-03-26 NOTE — ED Notes (Signed)
Ice applied

## 2023-03-26 NOTE — ED Triage Notes (Signed)
Pt POV with family reporting L knee pain after mechanical fall around noon. Brusing noted right below L knee, Denies hitting head, no LOC, no blood thinners.

## 2023-03-26 NOTE — ED Provider Notes (Signed)
[facility] EMERGENCY DEPARTMENT AT [facility] Provider Note   CSN: [id number] Arrival date & time: 03/26/23  1537     History  Chief Complaint  Patient presents with   Fall   Knee Pain    [name] Ruiz is a 77 y.o. female with a history of anemia, hypothyroidism, and scoliosis presents to the ED today after a fall.  Patient reports that she was walking laps in her basement needed today when she tripped over her own foot landing on her left knee. She denies hitting her head or LOC with the fall. She is not on blood thinners. Patient ambulates independently at baseline but does have a cane with her now due to pain with bending her knee after the accident.  She has taken Tylenol and iced her knee prior to arrival with improvement of pain and swelling. Denies weakness, inability to bear weight on left leg, numbness or tingling. No additional complaints or concerns at this time.      Home Medications Prior to Admission medications   Medication Sig Start Date End Date Taking? Authorizing Provider  acetaminophen (TYLENOL) 500 MG tablet Take 500 mg by mouth every 6 (six) hours as needed for moderate pain or headache.     [provider]  ARMOUR THYROID 15 MG tablet Take 15 mg by mouth once daily 09/09/15   [provider]  aspirin 325 MG tablet Take 325 mg by mouth daily.    [provider]  b complex vitamins tablet Take 1 tablet by mouth daily.    [provider]  Jeanie Cooks Serrata (BOSWELLIA PO) Take 1 capsule by mouth 2 (two) times daily.    [provider]  cholecalciferol (VITAMIN D) 1000 units tablet Take 4,000 Units by mouth 2 (two) times daily.     [provider]  DIGESTIVE ENZYMES PO Take 1 capsule by mouth 3 (three) times daily.     [provider]  EVENING PRIMROSE OIL PO Take 1 capsule by mouth 2 (two) times daily.    [provider]  Liniments (DEEP BLUE RELIEF EX) Apply 1 application topically 3  (three) times daily as needed (for pain).    [provider]  Magnesium 500 MG TABS Take 500 mg by mouth 2 (two) times daily.     [provider]  methocarbamol (ROBAXIN) 500 MG tablet Take 1 tablet (500 mg total) by mouth 3 (three) times daily as needed. 08/15/17   Beverely Low, MD  Misc Natural Products (TART CHERRY ADVANCED PO) Take 1 capsule by mouth 3 (three) times daily.    [provider]  Omega-3 Fatty Acids (FISH OIL PO) Take 1 capsule by mouth 3 (three) times daily.    [provider]  OVER THE COUNTER MEDICATION Apply 1 application topically 3 (three) times daily as needed (for pain). Super White Stuff Rub    [provider]  oxyCODONE-acetaminophen (ROXICET) 5-325 MG tablet Take 1-2 tablets by mouth every 4 (four) hours as needed for severe pain. Patient taking differently: Take 0.25-0.5 tablets by mouth every 4 (four) hours as needed for severe pain.  12/10/15   Beverely Low, MD  Selenium 200 MCG CAPS Take 200 mcg by mouth daily.     [provider]  sodium chloride (OCEAN) 0.65 % SOLN nasal spray Place 2-3 sprays into both nostrils as needed for congestion.    [provider]      Allergies    Astemizole, Bactrim [sulfamethoxazole-trimethoprim], Monosodium glutamate,  Benadryl [diphenhydramine], Cortisone, and Seldane [terfenadine]    Review of Systems   Review of Systems  Musculoskeletal:        Left knee pain  All other systems reviewed and are negative.   Physical Exam Updated Vital Signs BP (!) 152/74   Pulse 63   Temp 98.8 F (37.1 C) (Oral)   Resp 16   Ht 5' (1.524 m)   Wt 45.4 kg   SpO2 99%   BMI 19.53 kg/m  Physical Exam Vitals and nursing note reviewed.  Constitutional:      General: She is not in acute distress.    Appearance: Normal appearance.  HENT:     Head: Normocephalic and atraumatic.     Nose: Nose normal.     Mouth/Throat:     Mouth: Mucous membranes are moist.  Eyes:      Extraocular Movements: Extraocular movements intact.     Conjunctiva/sclera: Conjunctivae normal.     Pupils: Pupils are equal, round, and reactive to light.  Cardiovascular:     Rate and Rhythm: Normal rate and regular rhythm.     Pulses: Normal pulses.     Heart sounds: Normal heart sounds.  Pulmonary:     Effort: Pulmonary effort is normal.     Breath sounds: Normal breath sounds.  Abdominal:     Palpations: Abdomen is soft.     Tenderness: There is no abdominal tenderness.  Musculoskeletal:        General: Normal range of motion.     Cervical back: Normal range of motion. No tenderness.     Right lower leg: No edema.     Left lower leg: No edema.     Comments: Tenderness to palpation of the inferior left with with full ROM appreciated. No tenderness palpation of the calf or thigh, compartments are soft. ROM, strength, and sensation intact of upper and lower extremities bilaterally.   No tenderness, step-off, or deformity to the cervical, thoracic, or lumbar spine.  Patient has normal gait with ambulation in the room.  Skin:    General: Skin is warm and dry.     Findings: Bruising present. No rash.     Comments: Bruising present at the inferior aspect of the left knee, at the area of the tibial plateau with minor swelling  Neurological:     General: No focal deficit present.     Mental Status: She is alert.     Sensory: No sensory deficit.     Motor: No weakness.     Gait: Gait normal.  Psychiatric:        Mood and Affect: Mood normal.        Behavior: Behavior normal.    ED Results / Procedures / Treatments   Labs (all labs ordered are listed, but only abnormal results are displayed) Labs Reviewed - No data to display  EKG None  Radiology DG Knee Complete 4 Views Left Result Date: 03/26/2023 CLINICAL DATA:  Left knee pain after mechanical fall today. Bruising below the knee. EXAM: LEFT KNEE - COMPLETE 4+ VIEW COMPARISON:  None Available. FINDINGS: There is diffuse  decreased bone mineralization. Mild medial joint space narrowing. Minimal superior and inferior patellar degenerative spurring. No joint effusion. No acute fracture or dislocation. Mild soft tissue swelling within Hoffa's fat pad and anterior to the patellar tendon. IMPRESSION: Mild soft tissue swelling within Hoffa's fat pad and anterior to the patellar tendon. No acute fracture. Electronically Signed   By: Kerin Salen.D.  On: 03/26/2023 17:35    Procedures Procedures: not indicated.   Medications Ordered in ED Medications - No data to display  ED Course/ Medical Decision Making/ A&P                                 Medical Decision Making Amount and/or Complexity of Data Reviewed Radiology: ordered.   This patient presents to the ED for concern of left knee pain after fall, this involves an extensive number of treatment options, and is a complaint that carries with it a high risk of complications and morbidity.   Differential diagnosis includes: fracture, dislocation, ligamentous injury, joint effusion, contusion, etc.   Comorbidities  See HPI above   Additional History  Additional history obtained from prior records.   Imaging Studies  I ordered imaging studies including left knee x-ray  I independently visualized and interpreted imaging which showed:  Mild soft tissue swelling within Hoffa's fat pad and anterior to the patellar tendon. No acute fracture. I agree with the radiologist interpretation   Problem List / ED Course / Critical Interventions / Medication Management  Left knee pain after fall I ordered medications including: Ice for pain/swelling  Ace wrap for knee swelling Reevaluation of the patient after these medicines showed that the patient improved. Patient is able to ambulate in the room by herself. I have reviewed the patients home medicines and have made adjustments as needed   Social Determinants of Health  Physical activity   Test /  Admission - Considered  Discussed findings with patient. All questions were answered. She is hemodynamically stable and safe for discharge home. Return precautions provided.        Final Clinical Impression(s) / ED Diagnoses Final diagnoses:  Left anterior knee pain  Contusion of left knee, initial encounter    Rx / DC Orders ED Discharge Orders     None         Maxwell Marion, PA-C 03/26/23 1756    Loetta Rough, MD 03/26/23 [phone]

## 2023-03-29 DIAGNOSIS — M62830 Muscle spasm of back: Secondary | ICD-10-CM | POA: Diagnosis not present

## 2023-03-29 DIAGNOSIS — M9901 Segmental and somatic dysfunction of cervical region: Secondary | ICD-10-CM | POA: Diagnosis not present

## 2023-03-29 DIAGNOSIS — M545 Low back pain, unspecified: Secondary | ICD-10-CM | POA: Diagnosis not present

## 2023-03-29 DIAGNOSIS — M542 Cervicalgia: Secondary | ICD-10-CM | POA: Diagnosis not present

## 2023-03-31 DIAGNOSIS — M542 Cervicalgia: Secondary | ICD-10-CM | POA: Diagnosis not present

## 2023-03-31 DIAGNOSIS — M545 Low back pain, unspecified: Secondary | ICD-10-CM | POA: Diagnosis not present

## 2023-03-31 DIAGNOSIS — M62830 Muscle spasm of back: Secondary | ICD-10-CM | POA: Diagnosis not present

## 2023-03-31 DIAGNOSIS — M9901 Segmental and somatic dysfunction of cervical region: Secondary | ICD-10-CM | POA: Diagnosis not present

## 2023-04-04 DIAGNOSIS — M9901 Segmental and somatic dysfunction of cervical region: Secondary | ICD-10-CM | POA: Diagnosis not present

## 2023-04-04 DIAGNOSIS — M62830 Muscle spasm of back: Secondary | ICD-10-CM | POA: Diagnosis not present

## 2023-04-04 DIAGNOSIS — M545 Low back pain, unspecified: Secondary | ICD-10-CM | POA: Diagnosis not present

## 2023-04-04 DIAGNOSIS — M542 Cervicalgia: Secondary | ICD-10-CM | POA: Diagnosis not present

## 2023-04-07 DIAGNOSIS — M62830 Muscle spasm of back: Secondary | ICD-10-CM | POA: Diagnosis not present

## 2023-04-07 DIAGNOSIS — M545 Low back pain, unspecified: Secondary | ICD-10-CM | POA: Diagnosis not present

## 2023-04-07 DIAGNOSIS — M542 Cervicalgia: Secondary | ICD-10-CM | POA: Diagnosis not present

## 2023-04-07 DIAGNOSIS — M9901 Segmental and somatic dysfunction of cervical region: Secondary | ICD-10-CM | POA: Diagnosis not present

## 2023-04-08 DIAGNOSIS — M25562 Pain in left knee: Secondary | ICD-10-CM | POA: Diagnosis not present

## 2023-04-11 DIAGNOSIS — M9901 Segmental and somatic dysfunction of cervical region: Secondary | ICD-10-CM | POA: Diagnosis not present

## 2023-04-11 DIAGNOSIS — M545 Low back pain, unspecified: Secondary | ICD-10-CM | POA: Diagnosis not present

## 2023-04-11 DIAGNOSIS — M542 Cervicalgia: Secondary | ICD-10-CM | POA: Diagnosis not present

## 2023-04-11 DIAGNOSIS — M62830 Muscle spasm of back: Secondary | ICD-10-CM | POA: Diagnosis not present

## 2023-04-14 DIAGNOSIS — M542 Cervicalgia: Secondary | ICD-10-CM | POA: Diagnosis not present

## 2023-04-14 DIAGNOSIS — M545 Low back pain, unspecified: Secondary | ICD-10-CM | POA: Diagnosis not present

## 2023-04-14 DIAGNOSIS — M9901 Segmental and somatic dysfunction of cervical region: Secondary | ICD-10-CM | POA: Diagnosis not present

## 2023-04-14 DIAGNOSIS — M62830 Muscle spasm of back: Secondary | ICD-10-CM | POA: Diagnosis not present

## 2023-04-19 DIAGNOSIS — M62830 Muscle spasm of back: Secondary | ICD-10-CM | POA: Diagnosis not present

## 2023-04-19 DIAGNOSIS — M545 Low back pain, unspecified: Secondary | ICD-10-CM | POA: Diagnosis not present

## 2023-04-19 DIAGNOSIS — M542 Cervicalgia: Secondary | ICD-10-CM | POA: Diagnosis not present

## 2023-04-19 DIAGNOSIS — M9901 Segmental and somatic dysfunction of cervical region: Secondary | ICD-10-CM | POA: Diagnosis not present

## 2023-04-21 DIAGNOSIS — M9901 Segmental and somatic dysfunction of cervical region: Secondary | ICD-10-CM | POA: Diagnosis not present

## 2023-04-21 DIAGNOSIS — M545 Low back pain, unspecified: Secondary | ICD-10-CM | POA: Diagnosis not present

## 2023-04-21 DIAGNOSIS — M62830 Muscle spasm of back: Secondary | ICD-10-CM | POA: Diagnosis not present

## 2023-04-21 DIAGNOSIS — M542 Cervicalgia: Secondary | ICD-10-CM | POA: Diagnosis not present

## 2023-04-26 DIAGNOSIS — M9901 Segmental and somatic dysfunction of cervical region: Secondary | ICD-10-CM | POA: Diagnosis not present

## 2023-04-26 DIAGNOSIS — M542 Cervicalgia: Secondary | ICD-10-CM | POA: Diagnosis not present

## 2023-04-26 DIAGNOSIS — M25562 Pain in left knee: Secondary | ICD-10-CM | POA: Diagnosis not present

## 2023-04-26 DIAGNOSIS — M62830 Muscle spasm of back: Secondary | ICD-10-CM | POA: Diagnosis not present

## 2023-04-26 DIAGNOSIS — M545 Low back pain, unspecified: Secondary | ICD-10-CM | POA: Diagnosis not present

## 2023-04-28 DIAGNOSIS — M545 Low back pain, unspecified: Secondary | ICD-10-CM | POA: Diagnosis not present

## 2023-04-28 DIAGNOSIS — M9901 Segmental and somatic dysfunction of cervical region: Secondary | ICD-10-CM | POA: Diagnosis not present

## 2023-04-28 DIAGNOSIS — M62830 Muscle spasm of back: Secondary | ICD-10-CM | POA: Diagnosis not present

## 2023-04-28 DIAGNOSIS — M542 Cervicalgia: Secondary | ICD-10-CM | POA: Diagnosis not present

## 2023-05-03 DIAGNOSIS — M62830 Muscle spasm of back: Secondary | ICD-10-CM | POA: Diagnosis not present

## 2023-05-03 DIAGNOSIS — M542 Cervicalgia: Secondary | ICD-10-CM | POA: Diagnosis not present

## 2023-05-03 DIAGNOSIS — M9901 Segmental and somatic dysfunction of cervical region: Secondary | ICD-10-CM | POA: Diagnosis not present

## 2023-05-03 DIAGNOSIS — M545 Low back pain, unspecified: Secondary | ICD-10-CM | POA: Diagnosis not present

## 2023-07-19 DIAGNOSIS — M62830 Muscle spasm of back: Secondary | ICD-10-CM | POA: Diagnosis not present

## 2023-07-19 DIAGNOSIS — M545 Low back pain, unspecified: Secondary | ICD-10-CM | POA: Diagnosis not present

## 2023-07-19 DIAGNOSIS — M542 Cervicalgia: Secondary | ICD-10-CM | POA: Diagnosis not present

## 2023-07-19 DIAGNOSIS — M9901 Segmental and somatic dysfunction of cervical region: Secondary | ICD-10-CM | POA: Diagnosis not present

## 2023-08-03 DIAGNOSIS — M9901 Segmental and somatic dysfunction of cervical region: Secondary | ICD-10-CM | POA: Diagnosis not present

## 2023-08-03 DIAGNOSIS — M542 Cervicalgia: Secondary | ICD-10-CM | POA: Diagnosis not present

## 2023-08-03 DIAGNOSIS — M62830 Muscle spasm of back: Secondary | ICD-10-CM | POA: Diagnosis not present

## 2023-08-03 DIAGNOSIS — M545 Low back pain, unspecified: Secondary | ICD-10-CM | POA: Diagnosis not present

## 2023-08-10 DIAGNOSIS — E039 Hypothyroidism, unspecified: Secondary | ICD-10-CM | POA: Diagnosis not present

## 2023-08-17 DIAGNOSIS — M545 Low back pain, unspecified: Secondary | ICD-10-CM | POA: Diagnosis not present

## 2023-08-17 DIAGNOSIS — M542 Cervicalgia: Secondary | ICD-10-CM | POA: Diagnosis not present

## 2023-08-17 DIAGNOSIS — M9901 Segmental and somatic dysfunction of cervical region: Secondary | ICD-10-CM | POA: Diagnosis not present

## 2023-08-17 DIAGNOSIS — M62830 Muscle spasm of back: Secondary | ICD-10-CM | POA: Diagnosis not present

## 2023-08-30 DIAGNOSIS — D72819 Decreased white blood cell count, unspecified: Secondary | ICD-10-CM | POA: Diagnosis not present

## 2023-08-31 DIAGNOSIS — M545 Low back pain, unspecified: Secondary | ICD-10-CM | POA: Diagnosis not present

## 2023-08-31 DIAGNOSIS — E559 Vitamin D deficiency, unspecified: Secondary | ICD-10-CM | POA: Diagnosis not present

## 2023-08-31 DIAGNOSIS — M542 Cervicalgia: Secondary | ICD-10-CM | POA: Diagnosis not present

## 2023-08-31 DIAGNOSIS — R7989 Other specified abnormal findings of blood chemistry: Secondary | ICD-10-CM | POA: Diagnosis not present

## 2023-08-31 DIAGNOSIS — M9901 Segmental and somatic dysfunction of cervical region: Secondary | ICD-10-CM | POA: Diagnosis not present

## 2023-08-31 DIAGNOSIS — E039 Hypothyroidism, unspecified: Secondary | ICD-10-CM | POA: Diagnosis not present

## 2023-08-31 DIAGNOSIS — R5383 Other fatigue: Secondary | ICD-10-CM | POA: Diagnosis not present

## 2023-08-31 DIAGNOSIS — M62830 Muscle spasm of back: Secondary | ICD-10-CM | POA: Diagnosis not present

## 2023-08-31 DIAGNOSIS — D72819 Decreased white blood cell count, unspecified: Secondary | ICD-10-CM | POA: Diagnosis not present

## 2023-09-10 DIAGNOSIS — E039 Hypothyroidism, unspecified: Secondary | ICD-10-CM | POA: Diagnosis not present

## 2023-09-14 DIAGNOSIS — M545 Low back pain, unspecified: Secondary | ICD-10-CM | POA: Diagnosis not present

## 2023-09-14 DIAGNOSIS — M9901 Segmental and somatic dysfunction of cervical region: Secondary | ICD-10-CM | POA: Diagnosis not present

## 2023-09-14 DIAGNOSIS — M542 Cervicalgia: Secondary | ICD-10-CM | POA: Diagnosis not present

## 2023-09-14 DIAGNOSIS — M62830 Muscle spasm of back: Secondary | ICD-10-CM | POA: Diagnosis not present

## 2023-09-28 DIAGNOSIS — M545 Low back pain, unspecified: Secondary | ICD-10-CM | POA: Diagnosis not present

## 2023-09-28 DIAGNOSIS — M62830 Muscle spasm of back: Secondary | ICD-10-CM | POA: Diagnosis not present

## 2023-09-28 DIAGNOSIS — M9901 Segmental and somatic dysfunction of cervical region: Secondary | ICD-10-CM | POA: Diagnosis not present

## 2023-09-28 DIAGNOSIS — M542 Cervicalgia: Secondary | ICD-10-CM | POA: Diagnosis not present

## 2023-10-04 DIAGNOSIS — Z23 Encounter for immunization: Secondary | ICD-10-CM | POA: Diagnosis not present

## 2023-10-10 DIAGNOSIS — E039 Hypothyroidism, unspecified: Secondary | ICD-10-CM | POA: Diagnosis not present

## 2023-10-12 DIAGNOSIS — M545 Low back pain, unspecified: Secondary | ICD-10-CM | POA: Diagnosis not present

## 2023-10-12 DIAGNOSIS — M9901 Segmental and somatic dysfunction of cervical region: Secondary | ICD-10-CM | POA: Diagnosis not present

## 2023-10-12 DIAGNOSIS — M62830 Muscle spasm of back: Secondary | ICD-10-CM | POA: Diagnosis not present

## 2023-10-12 DIAGNOSIS — M542 Cervicalgia: Secondary | ICD-10-CM | POA: Diagnosis not present

## 2023-10-19 DIAGNOSIS — M62830 Muscle spasm of back: Secondary | ICD-10-CM | POA: Diagnosis not present

## 2023-10-19 DIAGNOSIS — M542 Cervicalgia: Secondary | ICD-10-CM | POA: Diagnosis not present

## 2023-10-19 DIAGNOSIS — M545 Low back pain, unspecified: Secondary | ICD-10-CM | POA: Diagnosis not present

## 2023-10-19 DIAGNOSIS — M9901 Segmental and somatic dysfunction of cervical region: Secondary | ICD-10-CM | POA: Diagnosis not present

## 2023-11-10 DIAGNOSIS — E039 Hypothyroidism, unspecified: Secondary | ICD-10-CM | POA: Diagnosis not present

## 2023-11-15 DIAGNOSIS — D72819 Decreased white blood cell count, unspecified: Secondary | ICD-10-CM | POA: Diagnosis not present

## 2023-11-16 DIAGNOSIS — M9901 Segmental and somatic dysfunction of cervical region: Secondary | ICD-10-CM | POA: Diagnosis not present

## 2023-11-16 DIAGNOSIS — M542 Cervicalgia: Secondary | ICD-10-CM | POA: Diagnosis not present

## 2023-11-16 DIAGNOSIS — M545 Low back pain, unspecified: Secondary | ICD-10-CM | POA: Diagnosis not present

## 2023-11-16 DIAGNOSIS — M62830 Muscle spasm of back: Secondary | ICD-10-CM | POA: Diagnosis not present

## 2023-11-30 DIAGNOSIS — M62830 Muscle spasm of back: Secondary | ICD-10-CM | POA: Diagnosis not present

## 2023-11-30 DIAGNOSIS — M545 Low back pain, unspecified: Secondary | ICD-10-CM | POA: Diagnosis not present

## 2023-11-30 DIAGNOSIS — M542 Cervicalgia: Secondary | ICD-10-CM | POA: Diagnosis not present

## 2023-11-30 DIAGNOSIS — M9901 Segmental and somatic dysfunction of cervical region: Secondary | ICD-10-CM | POA: Diagnosis not present

## 2023-12-02 ENCOUNTER — Emergency Department (HOSPITAL_COMMUNITY)
Admission: EM | Admit: 2023-12-02 | Discharge: 2023-12-03 | Disposition: A | Discharge status: Home / Self Care | Attending: Emergency Medicine | Admitting: Emergency Medicine

## 2023-12-02 DIAGNOSIS — R040 Epistaxis: Secondary | ICD-10-CM | POA: Insufficient documentation

## 2023-12-02 DIAGNOSIS — Z7982 Long term (current) use of aspirin: Secondary | ICD-10-CM | POA: Insufficient documentation

## 2023-12-02 NOTE — ED Triage Notes (Signed)
 Pt bib [facility] from home where she has had a nose bleed for about 2 hours. Pt tried everything at home to get it to stop. Denies history of same. Pt blood pressure with EMS was initially 210 systolic then came down to 170 before arrival. No history of HTN.

## 2023-12-02 NOTE — ED Triage Notes (Incomplete)
 Pt bib [facility] from home where she has had a nose bleed for about 2 hours. Pt tried everything at home to get it to stop. Denies history of same. Pt blood pressure with EMS was initially 210 systolic then came down to 170 before arrival. No history of HTN.

## 2023-12-03 ENCOUNTER — Other Ambulatory Visit: Payer: Self-pay

## 2023-12-03 DIAGNOSIS — R040 Epistaxis: Secondary | ICD-10-CM | POA: Diagnosis not present

## 2023-12-03 MED ORDER — TRANEXAMIC ACID FOR EPISTAXIS
500.0000 mg | Freq: Once | TOPICAL | Status: AC
  Administered 2023-12-03: 500 mg via TOPICAL
  Filled 2023-12-03 (×2): qty 10

## 2023-12-03 MED ORDER — TRANEXAMIC ACID FOR EPISTAXIS
500.0000 mg | Freq: Once | TOPICAL | Status: AC
  Administered 2023-12-03: 500 mg via TOPICAL
  Filled 2023-12-03: qty 10

## 2023-12-03 MED ORDER — SILVER NITRATE-POT NITRATE 75-25 % EX MISC
1.0000 | Freq: Once | CUTANEOUS | Status: AC
  Administered 2023-12-03: 1 via TOPICAL
  Filled 2023-12-03: qty 1

## 2023-12-03 MED ORDER — OXYMETAZOLINE HCL 0.05 % NA SOLN: 1.0000 | Freq: Two times a day (BID) | NASAL | 0 refills | Status: AC

## 2023-12-03 MED ORDER — OXYMETAZOLINE HCL 0.05 % NA SOLN
1.0000 | Freq: Once | NASAL | Status: AC
  Administered 2023-12-03: 1 via NASAL
  Filled 2023-12-03: qty 30

## 2023-12-03 NOTE — ED Notes (Signed)
Cab voucher given to pt

## 2023-12-03 NOTE — ED Provider Notes (Signed)
 [place] EMERGENCY DEPARTMENT AT [facility] Provider Note   CSN: [id number] Arrival date & time: 12/02/23  2358     Patient presents with: No chief complaint on file.   [name] Ruiz is a 78 y.o. female.   The history is provided by the patient.  Epistaxis Location:  L nare Severity:  Moderate Duration:  2 hours Timing:  Constant Progression:  Unchanged Chronicity:  New Context: not anticoagulants, not thrombocytopenia and not trauma   Relieved by:  Nothing Worsened by:  Nothing Ineffective treatments:  None tried Associated symptoms: no blood in oropharynx, no dizziness, no facial pain and no fever   Risk factors: no head and neck surgery, no recent chemotherapy and no recent nasal surgery        Prior to Admission medications   Medication Sig Start Date End Date Taking? Authorizing Provider  oxymetazoline  (AFRIN NASAL SPRAY) 0.05 % nasal spray Place 1 spray into both nostrils 2 (two) times daily. For 2 days 12/03/23  Yes Ariannah Arenson, MD  acetaminophen  (TYLENOL ) 500 MG tablet Take 500 mg by mouth every 6 (six) hours as needed for moderate pain or headache.     [provider]  ARMOUR THYROID  15 MG tablet Take 15 mg by mouth once daily 09/09/15   [provider]  aspirin  325 MG tablet Take 325 mg by mouth daily.    [provider]  b complex vitamins tablet Take 1 tablet by mouth daily.    [provider]  Luane Serrata (BOSWELLIA PO) Take 1 capsule by mouth 2 (two) times daily.    [provider]  cholecalciferol  (VITAMIN D ) 1000 units tablet Take 4,000 Units by mouth 2 (two) times daily.     [provider]  DIGESTIVE ENZYMES PO Take 1 capsule by mouth 3 (three) times daily.     [provider]  EVENING PRIMROSE OIL PO Take 1 capsule by mouth 2 (two) times daily.    [provider]  Liniments (DEEP BLUE RELIEF EX) Apply 1 application topically 3 (three) times daily as needed (for  pain).    [provider]  Magnesium  500 MG TABS Take 500 mg by mouth 2 (two) times daily.     [provider]  methocarbamol  (ROBAXIN ) 500 MG tablet Take 1 tablet (500 mg total) by mouth 3 (three) times daily as needed. 08/15/17   Kay Kemps, MD  Misc Natural Products (TART CHERRY ADVANCED PO) Take 1 capsule by mouth 3 (three) times daily.    [provider]  Omega-3 Fatty Acids (FISH OIL PO) Take 1 capsule by mouth 3 (three) times daily.    [provider]  OVER THE COUNTER MEDICATION Apply 1 application topically 3 (three) times daily as needed (for pain). Super White Stuff Rub    [provider]  oxyCODONE -acetaminophen  (ROXICET) 5-325 MG tablet Take 1-2 tablets by mouth every 4 (four) hours as needed for severe pain. Patient taking differently: Take 0.25-0.5 tablets by mouth every 4 (four) hours as needed for severe pain.  12/10/15   Kay Kemps, MD  Selenium  200 MCG CAPS Take 200 mcg by mouth daily.     [provider]  sodium chloride  (OCEAN) 0.65 % SOLN nasal spray Place 2-3 sprays into both nostrils as needed for congestion.    [provider]    Allergies: Astemizole, Bactrim [sulfamethoxazole-trimethoprim], Monosodium glutamate, Benadryl [diphenhydramine], Cortisone, and Seldane [terfenadine]    Review of Systems  Constitutional:  Negative for  fever.  HENT:  Positive for nosebleeds.   Neurological:  Negative for dizziness.  All other systems reviewed and are negative.   Updated Vital Signs BP (!) 176/93   Pulse 81   Temp 98.9 F (37.2 C) (Oral)   Resp 18   Ht 5' (1.524 m)   Wt 44.9 kg   SpO2 100%   BMI 19.33 kg/m   Physical Exam Vitals and nursing note reviewed.  Constitutional:      General: She is not in acute distress.    Appearance: Normal appearance. She is well-developed.  HENT:     Head: Normocephalic and atraumatic.     Nose: No rhinorrhea.     Comments: Left anterior bleeding at Nare  Eyes:      Pupils: Pupils are equal, round, and reactive to light.  Cardiovascular:     Rate and Rhythm: Normal rate and regular rhythm.     Pulses: Normal pulses.     Heart sounds: Normal heart sounds.  Pulmonary:     Effort: Pulmonary effort is normal. No respiratory distress.     Breath sounds: Normal breath sounds.  Abdominal:     General: Bowel sounds are normal. There is no distension.     Palpations: Abdomen is soft.     Tenderness: There is no abdominal tenderness. There is no guarding or rebound.  Genitourinary:    Vagina: No vaginal discharge.  Musculoskeletal:        General: Normal range of motion.     Cervical back: Normal range of motion and neck supple.  Skin:    General: Skin is warm and dry.     Capillary Refill: Capillary refill takes less than 2 seconds.     Findings: No erythema or rash.  Neurological:     General: No focal deficit present.     Mental Status: She is alert and oriented to person, place, and time.     Deep Tendon Reflexes: Reflexes normal.  Psychiatric:        Mood and Affect: Mood normal.     (all labs ordered are listed, but only abnormal results are displayed) Labs Reviewed - No data to display  EKG: None  Radiology: No results found.   Epistaxis Management  Date/Time: 12/03/2023 12:46 AM  Performed by: Nettie Earing, MD Authorized by: Nettie Earing, MD   Consent:    Consent obtained:  Verbal   Consent given by:  Patient   Risks discussed:  Bleeding, infection, nasal injury and pain   Alternatives discussed:  Delayed treatment Universal protocol:    Patient identity confirmed:  Arm band Procedure details:    Treatment site:  L anterior and R anterior   Treatment method:  Silver  nitrate   Treatment complexity:  Limited   Treatment episode: initial   Post-procedure details:    Assessment:  Bleeding stopped   Procedure completion:  Tolerated well, no immediate complications Comments:     Rebled and recauterized and then  stopped     Medications Ordered in the ED  silver  nitrate applicators applicator 1 Stick (1 Stick Topical Given 12/03/23 0023)  tranexamic acid  (CYKLOKAPRON ) 1000 MG/10ML topical solution 500 mg (500 mg Topical Given 12/03/23 0040)  oxymetazoline  (AFRIN) 0.05 % nasal spray 1 spray (1 spray Each Nare Given 12/03/23 0023)  Medical Decision Making Nose bleeding for 2 hours, stopped   Amount and/or Complexity of Data Reviewed Independent Historian: EMS    Details: See above  External Data Reviewed: notes.    Details: Previous notes reviewed   Risk OTC drugs. Prescription drug management. Risk Details: Well appearing, completely stopped observed in the ED.  Stable for discharge.  Strict returns.       Final diagnoses:  Epistaxis    ED Discharge Orders          Ordered    oxymetazoline  [facility] NASAL SPRAY) 0.05 % nasal spray  2 times daily        12/03/23 0007               Joelys Staubs, MD 12/03/23 [id number]

## 2023-12-11 DIAGNOSIS — E039 Hypothyroidism, unspecified: Secondary | ICD-10-CM | POA: Diagnosis not present

## 2023-12-13 DIAGNOSIS — R000 Tachycardia, unspecified: Secondary | ICD-10-CM | POA: Diagnosis not present

## 2023-12-14 DIAGNOSIS — M9901 Segmental and somatic dysfunction of cervical region: Secondary | ICD-10-CM | POA: Diagnosis not present

## 2023-12-14 DIAGNOSIS — M545 Low back pain, unspecified: Secondary | ICD-10-CM | POA: Diagnosis not present

## 2023-12-14 DIAGNOSIS — M62830 Muscle spasm of back: Secondary | ICD-10-CM | POA: Diagnosis not present

## 2023-12-14 DIAGNOSIS — M542 Cervicalgia: Secondary | ICD-10-CM | POA: Diagnosis not present

## 2023-12-21 DIAGNOSIS — M62830 Muscle spasm of back: Secondary | ICD-10-CM | POA: Diagnosis not present

## 2023-12-21 DIAGNOSIS — M542 Cervicalgia: Secondary | ICD-10-CM | POA: Diagnosis not present

## 2023-12-21 DIAGNOSIS — M9901 Segmental and somatic dysfunction of cervical region: Secondary | ICD-10-CM | POA: Diagnosis not present

## 2023-12-21 DIAGNOSIS — M545 Low back pain, unspecified: Secondary | ICD-10-CM | POA: Diagnosis not present

## 2023-12-22 DIAGNOSIS — Z23 Encounter for immunization: Secondary | ICD-10-CM | POA: Diagnosis not present

## 2024-01-04 DIAGNOSIS — M545 Low back pain, unspecified: Secondary | ICD-10-CM | POA: Diagnosis not present

## 2024-01-04 DIAGNOSIS — M9901 Segmental and somatic dysfunction of cervical region: Secondary | ICD-10-CM | POA: Diagnosis not present

## 2024-01-04 DIAGNOSIS — M542 Cervicalgia: Secondary | ICD-10-CM | POA: Diagnosis not present

## 2024-01-04 DIAGNOSIS — M62830 Muscle spasm of back: Secondary | ICD-10-CM | POA: Diagnosis not present

## 2024-01-09 DIAGNOSIS — R000 Tachycardia, unspecified: Secondary | ICD-10-CM | POA: Diagnosis not present

## 2024-01-10 DIAGNOSIS — E039 Hypothyroidism, unspecified: Secondary | ICD-10-CM | POA: Diagnosis not present

## 2024-01-18 DIAGNOSIS — M62830 Muscle spasm of back: Secondary | ICD-10-CM | POA: Diagnosis not present

## 2024-01-18 DIAGNOSIS — M545 Low back pain, unspecified: Secondary | ICD-10-CM | POA: Diagnosis not present

## 2024-01-18 DIAGNOSIS — M542 Cervicalgia: Secondary | ICD-10-CM | POA: Diagnosis not present

## 2024-01-18 DIAGNOSIS — M9901 Segmental and somatic dysfunction of cervical region: Secondary | ICD-10-CM | POA: Diagnosis not present

## 2024-02-01 DIAGNOSIS — M546 Pain in thoracic spine: Secondary | ICD-10-CM | POA: Diagnosis not present

## 2024-02-01 DIAGNOSIS — M9905 Segmental and somatic dysfunction of pelvic region: Secondary | ICD-10-CM | POA: Diagnosis not present

## 2024-02-01 DIAGNOSIS — M9903 Segmental and somatic dysfunction of lumbar region: Secondary | ICD-10-CM | POA: Diagnosis not present

## 2024-02-01 DIAGNOSIS — M9901 Segmental and somatic dysfunction of cervical region: Secondary | ICD-10-CM | POA: Diagnosis not present

## 2024-02-01 DIAGNOSIS — M9902 Segmental and somatic dysfunction of thoracic region: Secondary | ICD-10-CM | POA: Diagnosis not present

## 2024-02-01 DIAGNOSIS — M62830 Muscle spasm of back: Secondary | ICD-10-CM | POA: Diagnosis not present

## 2024-02-01 DIAGNOSIS — M9906 Segmental and somatic dysfunction of lower extremity: Secondary | ICD-10-CM | POA: Diagnosis not present

## 2024-02-01 DIAGNOSIS — M542 Cervicalgia: Secondary | ICD-10-CM | POA: Diagnosis not present

## 2024-02-01 DIAGNOSIS — M545 Low back pain, unspecified: Secondary | ICD-10-CM | POA: Diagnosis not present

## 2024-02-01 DIAGNOSIS — M9904 Segmental and somatic dysfunction of sacral region: Secondary | ICD-10-CM | POA: Diagnosis not present

## 2024-02-10 DIAGNOSIS — E039 Hypothyroidism, unspecified: Secondary | ICD-10-CM | POA: Diagnosis not present

## 2024-02-14 DIAGNOSIS — D892 Hypergammaglobulinemia, unspecified: Secondary | ICD-10-CM | POA: Diagnosis not present

## 2024-02-14 DIAGNOSIS — R5383 Other fatigue: Secondary | ICD-10-CM | POA: Diagnosis not present

## 2024-02-14 DIAGNOSIS — E039 Hypothyroidism, unspecified: Secondary | ICD-10-CM | POA: Diagnosis not present

## 2024-02-14 DIAGNOSIS — R7989 Other specified abnormal findings of blood chemistry: Secondary | ICD-10-CM | POA: Diagnosis not present

## 2024-02-16 DIAGNOSIS — M545 Low back pain, unspecified: Secondary | ICD-10-CM | POA: Diagnosis not present

## 2024-02-16 DIAGNOSIS — M9901 Segmental and somatic dysfunction of cervical region: Secondary | ICD-10-CM | POA: Diagnosis not present

## 2024-02-16 DIAGNOSIS — M9903 Segmental and somatic dysfunction of lumbar region: Secondary | ICD-10-CM | POA: Diagnosis not present

## 2024-02-16 DIAGNOSIS — M9904 Segmental and somatic dysfunction of sacral region: Secondary | ICD-10-CM | POA: Diagnosis not present

## 2024-02-16 DIAGNOSIS — M542 Cervicalgia: Secondary | ICD-10-CM | POA: Diagnosis not present

## 2024-02-16 DIAGNOSIS — M62830 Muscle spasm of back: Secondary | ICD-10-CM | POA: Diagnosis not present

## 2024-02-16 DIAGNOSIS — M9905 Segmental and somatic dysfunction of pelvic region: Secondary | ICD-10-CM | POA: Diagnosis not present

## 2024-02-16 DIAGNOSIS — M546 Pain in thoracic spine: Secondary | ICD-10-CM | POA: Diagnosis not present

## 2024-02-16 DIAGNOSIS — M9906 Segmental and somatic dysfunction of lower extremity: Secondary | ICD-10-CM | POA: Diagnosis not present

## 2024-02-16 DIAGNOSIS — M9902 Segmental and somatic dysfunction of thoracic region: Secondary | ICD-10-CM | POA: Diagnosis not present

## 2024-02-21 DIAGNOSIS — D72819 Decreased white blood cell count, unspecified: Secondary | ICD-10-CM | POA: Diagnosis not present

## 2024-02-22 DIAGNOSIS — D72819 Decreased white blood cell count, unspecified: Secondary | ICD-10-CM | POA: Diagnosis not present

## 2024-02-29 DIAGNOSIS — M542 Cervicalgia: Secondary | ICD-10-CM | POA: Diagnosis not present

## 2024-02-29 DIAGNOSIS — M545 Low back pain, unspecified: Secondary | ICD-10-CM | POA: Diagnosis not present

## 2024-02-29 DIAGNOSIS — M9906 Segmental and somatic dysfunction of lower extremity: Secondary | ICD-10-CM | POA: Diagnosis not present

## 2024-02-29 DIAGNOSIS — M9903 Segmental and somatic dysfunction of lumbar region: Secondary | ICD-10-CM | POA: Diagnosis not present

## 2024-02-29 DIAGNOSIS — M9905 Segmental and somatic dysfunction of pelvic region: Secondary | ICD-10-CM | POA: Diagnosis not present

## 2024-02-29 DIAGNOSIS — M546 Pain in thoracic spine: Secondary | ICD-10-CM | POA: Diagnosis not present

## 2024-02-29 DIAGNOSIS — M9904 Segmental and somatic dysfunction of sacral region: Secondary | ICD-10-CM | POA: Diagnosis not present

## 2024-02-29 DIAGNOSIS — M9901 Segmental and somatic dysfunction of cervical region: Secondary | ICD-10-CM | POA: Diagnosis not present

## 2024-02-29 DIAGNOSIS — M62830 Muscle spasm of back: Secondary | ICD-10-CM | POA: Diagnosis not present

## 2024-02-29 DIAGNOSIS — M9902 Segmental and somatic dysfunction of thoracic region: Secondary | ICD-10-CM | POA: Diagnosis not present

## 2024-03-11 DIAGNOSIS — E039 Hypothyroidism, unspecified: Secondary | ICD-10-CM | POA: Diagnosis not present

## 2024-03-14 DIAGNOSIS — M9905 Segmental and somatic dysfunction of pelvic region: Secondary | ICD-10-CM | POA: Diagnosis not present

## 2024-03-14 DIAGNOSIS — M545 Low back pain, unspecified: Secondary | ICD-10-CM | POA: Diagnosis not present

## 2024-03-14 DIAGNOSIS — M9902 Segmental and somatic dysfunction of thoracic region: Secondary | ICD-10-CM | POA: Diagnosis not present

## 2024-03-14 DIAGNOSIS — M9903 Segmental and somatic dysfunction of lumbar region: Secondary | ICD-10-CM | POA: Diagnosis not present

## 2024-03-14 DIAGNOSIS — M542 Cervicalgia: Secondary | ICD-10-CM | POA: Diagnosis not present

## 2024-03-14 DIAGNOSIS — M9904 Segmental and somatic dysfunction of sacral region: Secondary | ICD-10-CM | POA: Diagnosis not present

## 2024-03-14 DIAGNOSIS — M9906 Segmental and somatic dysfunction of lower extremity: Secondary | ICD-10-CM | POA: Diagnosis not present

## 2024-03-14 DIAGNOSIS — M546 Pain in thoracic spine: Secondary | ICD-10-CM | POA: Diagnosis not present

## 2024-03-14 DIAGNOSIS — M9901 Segmental and somatic dysfunction of cervical region: Secondary | ICD-10-CM | POA: Diagnosis not present

## 2024-03-14 DIAGNOSIS — M62830 Muscle spasm of back: Secondary | ICD-10-CM | POA: Diagnosis not present

## 2024-03-14 DIAGNOSIS — D72819 Decreased white blood cell count, unspecified: Secondary | ICD-10-CM | POA: Diagnosis not present

## 2024-03-23 DIAGNOSIS — D72819 Decreased white blood cell count, unspecified: Secondary | ICD-10-CM | POA: Diagnosis not present
